# Patient Record
Sex: Female | Born: 1980 | Race: Black or African American | Hispanic: No | Marital: Married | Smoking: Never smoker
Health system: Southern US, Community
[De-identification: ages and names within clinical notes are randomized; demographics above are authoritative.]

## PROBLEM LIST (undated history)

## (undated) ENCOUNTER — Inpatient Hospital Stay (HOSPITAL_COMMUNITY): Payer: Self-pay

## (undated) DIAGNOSIS — Z789 Other specified health status: Secondary | ICD-10-CM

## (undated) DIAGNOSIS — O039 Complete or unspecified spontaneous abortion without complication: Secondary | ICD-10-CM

## (undated) DIAGNOSIS — R7309 Other abnormal glucose: Secondary | ICD-10-CM

## (undated) DIAGNOSIS — O368130 Decreased fetal movements, third trimester, not applicable or unspecified: Secondary | ICD-10-CM

## (undated) DIAGNOSIS — N926 Irregular menstruation, unspecified: Secondary | ICD-10-CM

## (undated) DIAGNOSIS — O021 Missed abortion: Secondary | ICD-10-CM

## (undated) DIAGNOSIS — O208 Other hemorrhage in early pregnancy: Secondary | ICD-10-CM

## (undated) DIAGNOSIS — E6609 Other obesity due to excess calories: Secondary | ICD-10-CM

## (undated) DIAGNOSIS — Z3A32 32 weeks gestation of pregnancy: Secondary | ICD-10-CM

## (undated) DIAGNOSIS — R7989 Other specified abnormal findings of blood chemistry: Secondary | ICD-10-CM

## (undated) DIAGNOSIS — Z6830 Body mass index (BMI) 30.0-30.9, adult: Secondary | ICD-10-CM

## (undated) DIAGNOSIS — Z369 Encounter for antenatal screening, unspecified: Secondary | ICD-10-CM

## (undated) DIAGNOSIS — Z3490 Encounter for supervision of normal pregnancy, unspecified, unspecified trimester: Secondary | ICD-10-CM

## (undated) DIAGNOSIS — O09521 Supervision of elderly multigravida, first trimester: Secondary | ICD-10-CM

## (undated) DIAGNOSIS — E66811 Obesity, class 1: Secondary | ICD-10-CM

## (undated) HISTORY — PX: NO PAST SURGERIES: SHX2092

## (undated) MED FILL — ONDANSETRON 4MG ODT: 4 MG | 10 days supply | Qty: 30 | Fill #0 | Status: AC

---

## 2006-11-22 ENCOUNTER — Emergency Department: Payer: Self-pay | Admitting: Internal Medicine

## 2007-05-02 ENCOUNTER — Inpatient Hospital Stay: Payer: Self-pay | Admitting: Obstetrics and Gynecology

## 2007-05-18 ENCOUNTER — Ambulatory Visit: Payer: Self-pay | Admitting: Obstetrics and Gynecology

## 2007-05-20 ENCOUNTER — Ambulatory Visit: Payer: Self-pay | Admitting: Obstetrics and Gynecology

## 2008-10-17 ENCOUNTER — Emergency Department (HOSPITAL_COMMUNITY): Admission: EM | Admit: 2008-10-17 | Discharge: 2008-10-17 | Payer: Self-pay | Admitting: Emergency Medicine

## 2009-05-04 ENCOUNTER — Inpatient Hospital Stay: Payer: Self-pay | Admitting: Obstetrics and Gynecology

## 2010-01-19 IMAGING — US US OB COMP LESS 14 WK
1 series · 14 of 28 positions shown · non-contrast
Comparison: None

CLINICAL DATA: First trimester and vaginal bleeding

OBSTETRIC <14 WK US AND TRANSVAGINAL OB US
TECHNIQUE: Both transabdominal and transvaginal ultrasound
examinations were performed for complete evaluation of the
gestation as well as the maternal uterus, adnexal regions, and
pelvic cul-de-sac.

[Series 1: us ob comp less 14 wk · 0.24mm/px · 14 of 66 slices shown]
[im 3/66]
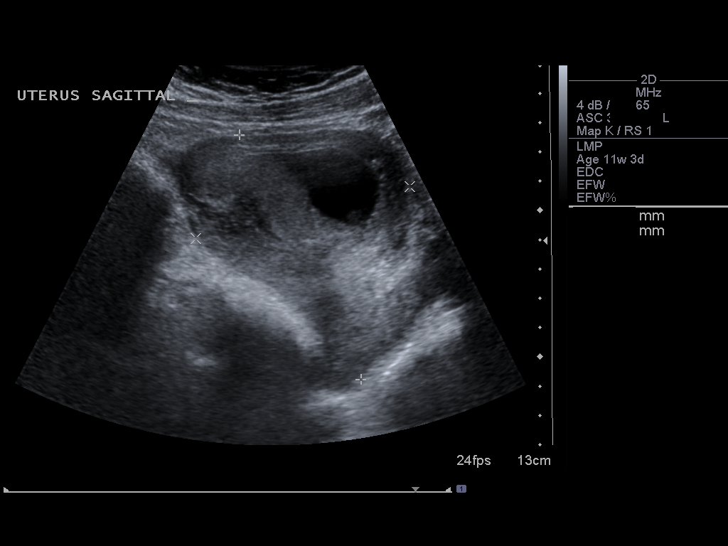
[im 8/66]
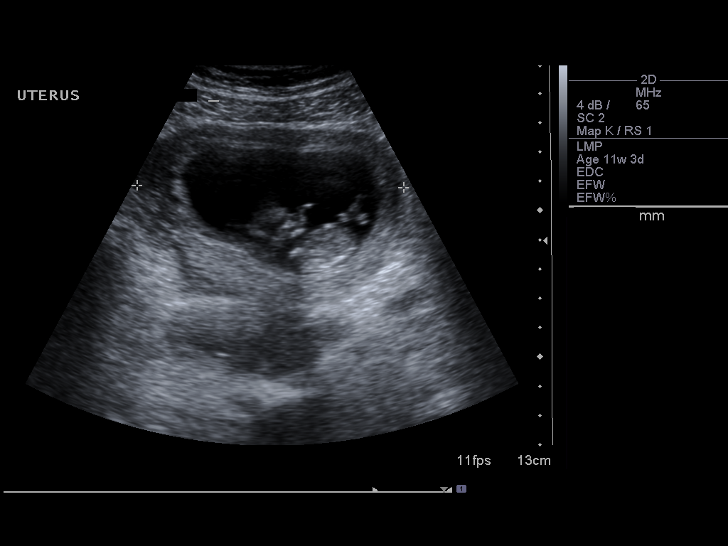
[im 13/66]
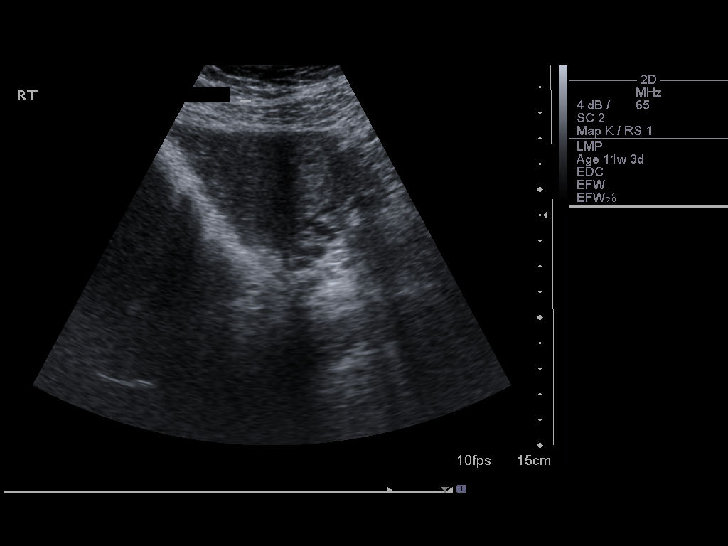
[im 17/66]
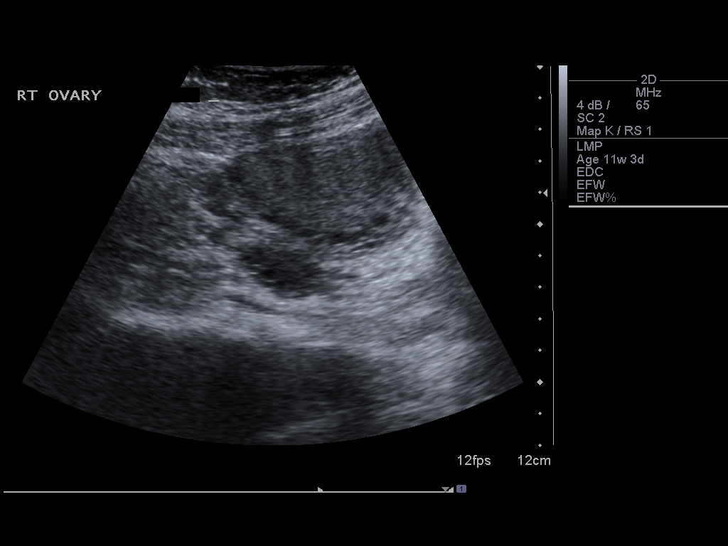
[im 22/66]
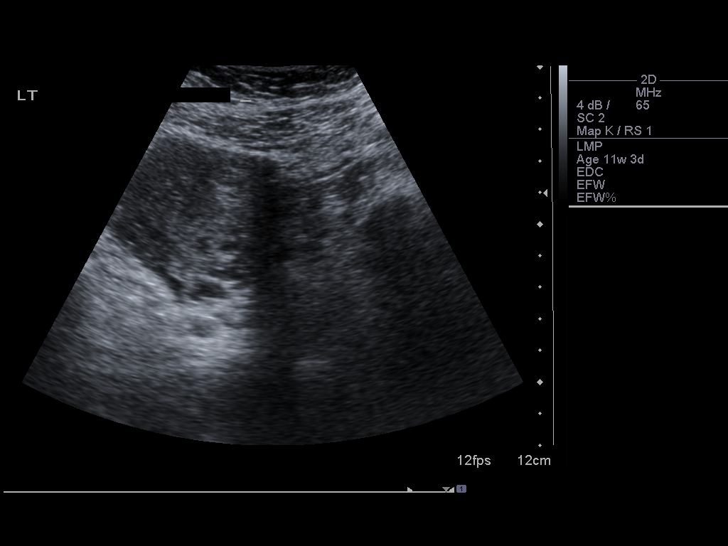
[im 27/66]
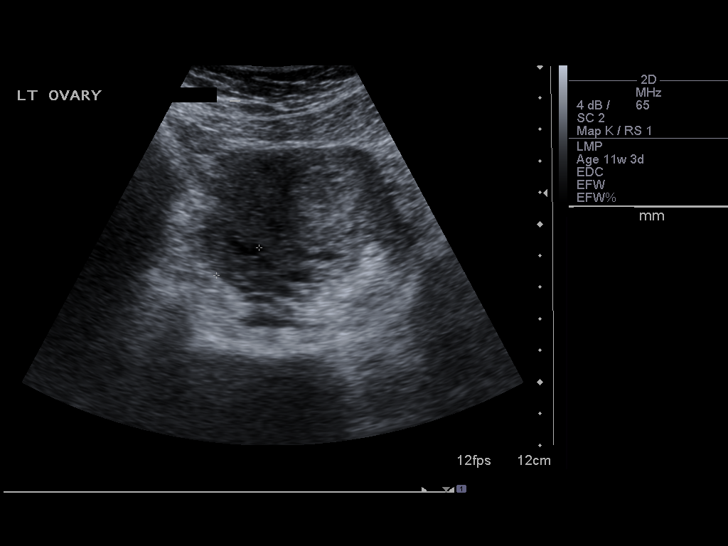
[im 32/66]
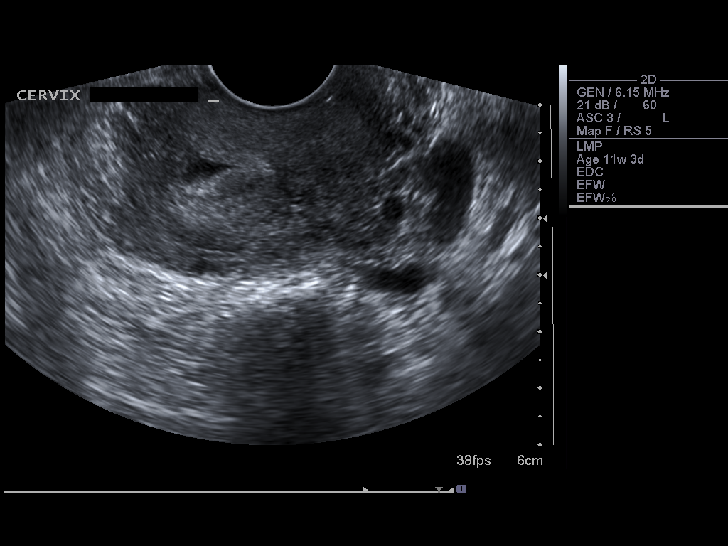
[im 37/66]
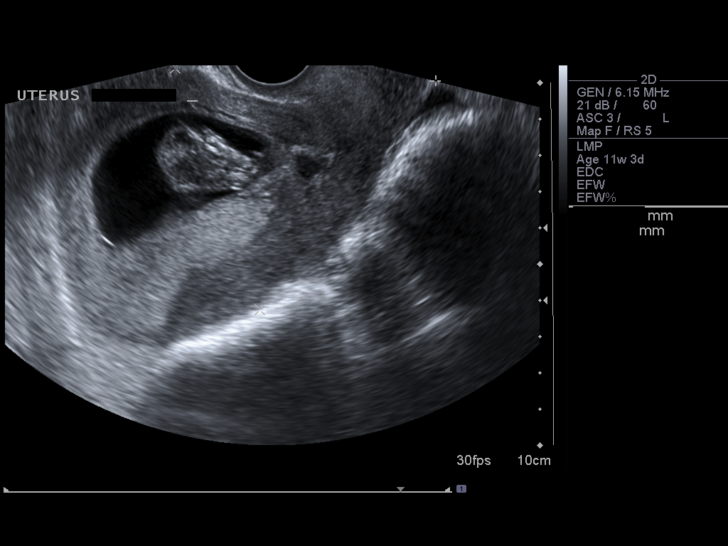
[im 41/66]
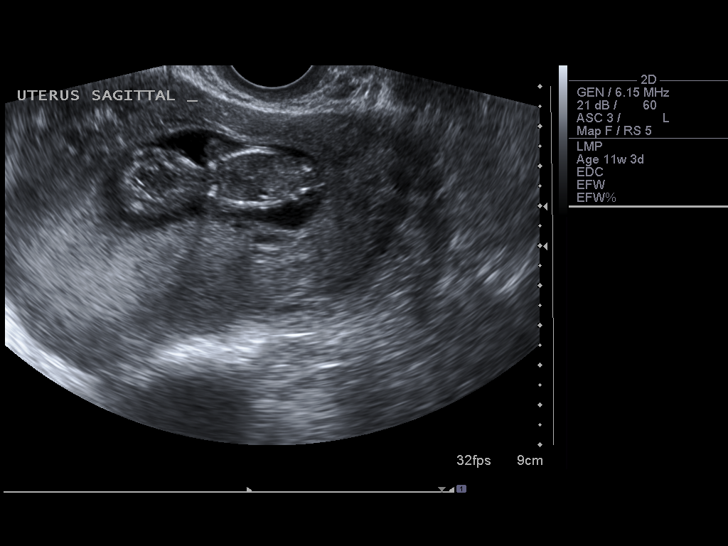
[im 46/66]
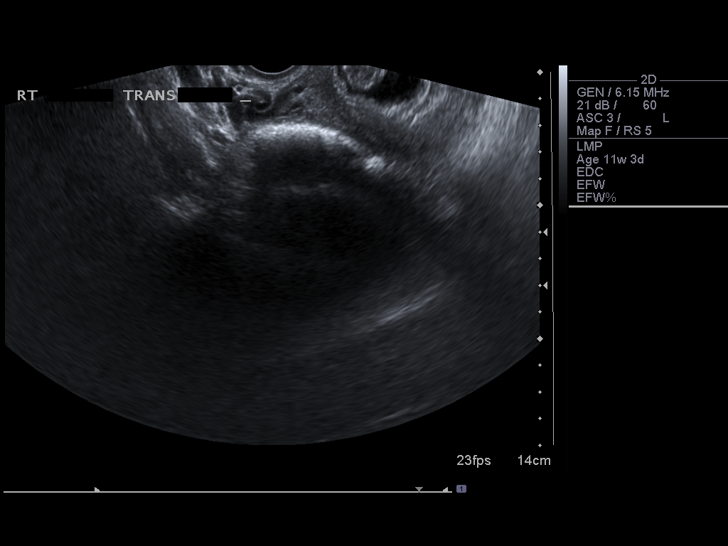
[im 51/66]
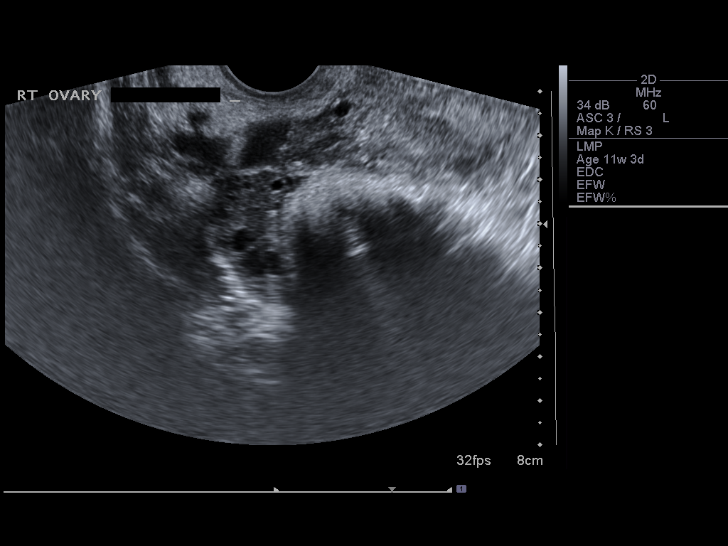
[im 56/66]
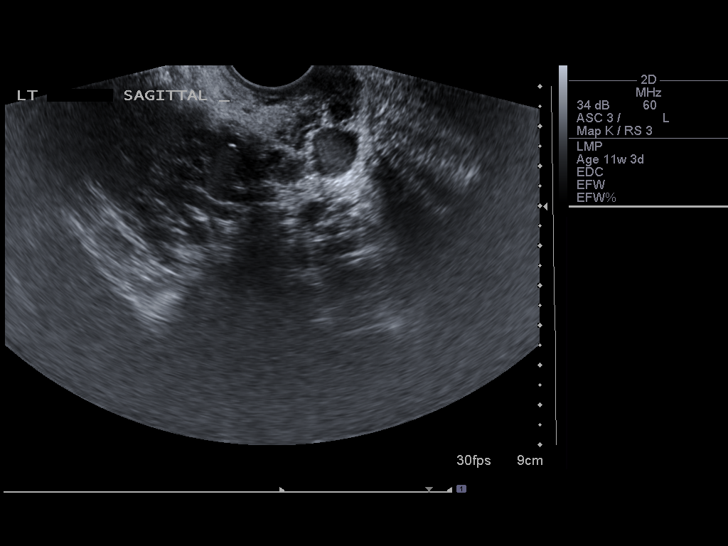
[im 61/66]
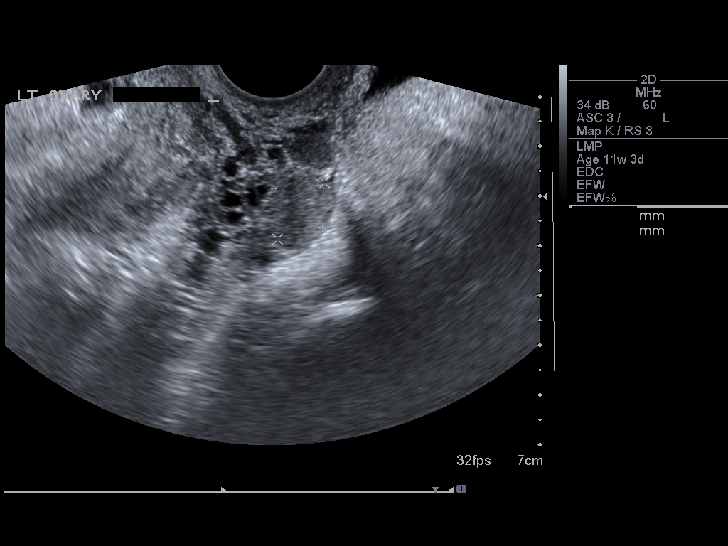
[im 66/66]
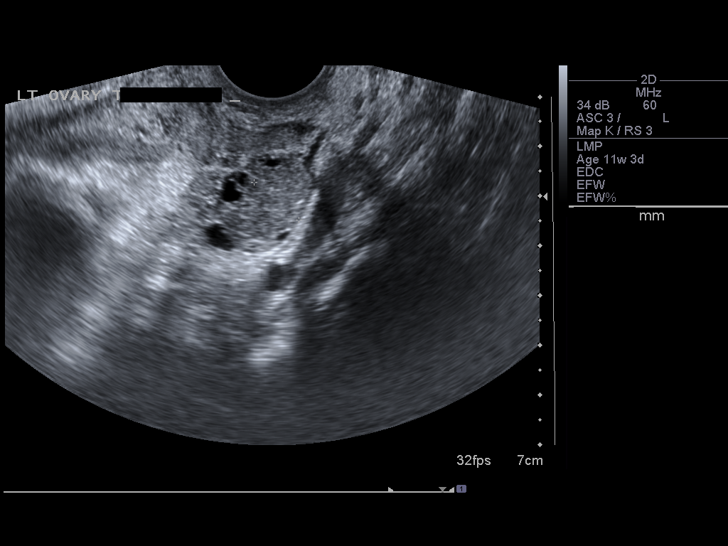

[14 of 28 positions shown; findings below may reference images not displayed]

Intrauterine gestational sac: Present
Yolk sac: Present
Embryo: Present
Cardiac Activity: Present
Heart Rate: 163 bpm

MSD:   mm      w     d
CRL: 49.9 millimeters           11   w  5   d         US EDC:
05/03/2009

Maternal uterus/adnexae:
A small amount of free fluid is present.  There is a small
subchorionic hemorrhage.  Ovaries are within normal limits.
IMPRESSION: Live intrauterine pregnancy with an estimated gestational age of 11
weeks and 5 days.  Fetal heart tones are 163 beats per minute.
Small subchorionic hemorrhage complicates the gestation.

## 2010-12-09 LAB — URINALYSIS, ROUTINE W REFLEX MICROSCOPIC
Glucose, UA: NEGATIVE mg/dL
Hgb urine dipstick: NEGATIVE
Specific Gravity, Urine: 1.034 — ABNORMAL HIGH (ref 1.005–1.030)

## 2010-12-09 LAB — HCG, QUANTITATIVE, PREGNANCY: hCG, Beta Chain, Quant, S: 82447 m[IU]/mL — ABNORMAL HIGH (ref <5)

## 2010-12-09 LAB — GC/CHLAMYDIA PROBE AMP, GENITAL: Chlamydia, DNA Probe: NEGATIVE

## 2010-12-09 LAB — WET PREP, GENITAL
Trich, Wet Prep: NONE SEEN
Yeast Wet Prep HPF POC: NONE SEEN

## 2010-12-09 LAB — POCT PREGNANCY, URINE: Preg Test, Ur: POSITIVE

## 2016-05-21 ENCOUNTER — Encounter (HOSPITAL_COMMUNITY): Payer: Self-pay | Admitting: *Deleted

## 2016-05-21 ENCOUNTER — Inpatient Hospital Stay (HOSPITAL_COMMUNITY)
Admission: AD | Admit: 2016-05-21 | Discharge: 2016-05-21 | Disposition: A | Discharge status: Home / Self Care | Payer: BLUE CROSS/BLUE SHIELD | Source: Ambulatory Visit | Attending: Obstetrics & Gynecology | Admitting: Obstetrics & Gynecology

## 2016-05-21 DIAGNOSIS — Z3A14 14 weeks gestation of pregnancy: Secondary | ICD-10-CM | POA: Diagnosis not present

## 2016-05-21 DIAGNOSIS — N898 Other specified noninflammatory disorders of vagina: Secondary | ICD-10-CM

## 2016-05-21 DIAGNOSIS — O26892 Other specified pregnancy related conditions, second trimester: Secondary | ICD-10-CM | POA: Insufficient documentation

## 2016-05-21 DIAGNOSIS — O26812 Pregnancy related exhaustion and fatigue, second trimester: Secondary | ICD-10-CM

## 2016-05-21 DIAGNOSIS — R42 Dizziness and giddiness: Secondary | ICD-10-CM | POA: Insufficient documentation

## 2016-05-21 DIAGNOSIS — R300 Dysuria: Secondary | ICD-10-CM | POA: Insufficient documentation

## 2016-05-21 DIAGNOSIS — O468X1 Other antepartum hemorrhage, first trimester: Secondary | ICD-10-CM

## 2016-05-21 DIAGNOSIS — O418X10 Other specified disorders of amniotic fluid and membranes, first trimester, not applicable or unspecified: Secondary | ICD-10-CM

## 2016-05-21 DIAGNOSIS — L298 Other pruritus: Secondary | ICD-10-CM

## 2016-05-21 HISTORY — DX: Other specified health status: Z78.9

## 2016-05-21 LAB — URINE MICROSCOPIC-ADD ON: RBC / HPF: NONE SEEN RBC/hpf (ref 0–5)

## 2016-05-21 LAB — CBC
HCT: 33.5 % — ABNORMAL LOW (ref 36.0–46.0)
HEMOGLOBIN: 11.8 g/dL — AB (ref 12.0–15.0)
MCH: 31.8 pg (ref 26.0–34.0)
MCHC: 35.2 g/dL (ref 30.0–36.0)
MCV: 90.3 fL (ref 78.0–100.0)
PLATELETS: 195 10*3/uL (ref 150–400)
RBC: 3.71 MIL/uL — AB (ref 3.87–5.11)
RDW: 13.8 % (ref 11.5–15.5)
WBC: 7.3 10*3/uL (ref 4.0–10.5)

## 2016-05-21 LAB — URINALYSIS, ROUTINE W REFLEX MICROSCOPIC
Bilirubin Urine: NEGATIVE
Glucose, UA: NEGATIVE mg/dL
Ketones, ur: NEGATIVE mg/dL
LEUKOCYTES UA: NEGATIVE
NITRITE: NEGATIVE
PH: 5.5 (ref 5.0–8.0)
Protein, ur: NEGATIVE mg/dL

## 2016-05-21 LAB — WET PREP, GENITAL
CLUE CELLS WET PREP: NONE SEEN
SPERM: NONE SEEN
Trich, Wet Prep: NONE SEEN
Yeast Wet Prep HPF POC: NONE SEEN

## 2016-05-21 LAB — OB RESULTS CONSOLE GBS: STREP GROUP B AG: POSITIVE

## 2016-05-21 MED ORDER — PRENATAL VITAMINS 0.8 MG PO TABS: 1.0000 | ORAL_TABLET | Freq: Every day | ORAL | 12 refills | Status: AC

## 2016-05-21 NOTE — Discharge Instructions (Signed)
Fatigue Fatigue is feeling tired all of the time, a lack of energy, or a lack of motivation. Occasional or mild fatigue is often a normal response to activity or life in general. However, long-lasting (chronic) or extreme fatigue may indicate an underlying medical condition. HOME CARE INSTRUCTIONS  Watch your fatigue for any changes. The following actions may help to lessen any discomfort you are feeling:  Talk to your health care provider about how much sleep you need each night. Try to get the required amount every night.  Take medicines only as directed by your health care provider.  Eat a healthy and nutritious diet. Ask your health care provider if you need help changing your diet.  Drink enough fluid to keep your urine clear or pale yellow.  Practice ways of relaxing, such as yoga, meditation, massage therapy, or acupuncture.  Exercise regularly.   Change situations that cause you stress. Try to keep your work and personal routine reasonable.  Do not abuse illegal drugs.  Limit alcohol intake to no more than 1 drink per day for nonpregnant women and 2 drinks per day for men. One drink equals 12 ounces of beer, 5 ounces of wine, or 1 ounces of hard liquor.  Take a multivitamin, if directed by your health care provider. SEEK MEDICAL CARE IF:   Your fatigue does not get better.  You have a fever.   You have unintentional weight loss or gain.  You have headaches.   You have difficulty:   Falling asleep.  Sleeping throughout the night.  You feel angry, guilty, anxious, or sad.   You are unable to have a bowel movement (constipation).   You skin is dry.   Your legs or another part of your body is swollen.  SEEK IMMEDIATE MEDICAL CARE IF:   You feel confused.   Your vision is blurry.  You feel faint or pass out.   You have a severe headache.   You have severe abdominal, pelvic, or back pain.   You have chest pain, shortness of breath, or an  irregular or fast heartbeat.   You are unable to urinate or you urinate less than normal.   You develop abnormal bleeding, such as bleeding from the rectum, vagina, nose, lungs, or nipples.  You vomit blood.   You have thoughts about harming yourself or committing suicide.   You are worried that you might harm someone else.    This information is not intended to replace advice given to you by your health care provider. Make sure you discuss any questions you have with your health care provider.   Document Released: 06/07/2007 Document Revised: 08/31/2014 Document Reviewed: 12/12/2013 Elsevier Interactive Patient Education 2016 [employer]. Second Trimester of Pregnancy The second trimester is from week 13 through week 28, months 4 through 6. The second trimester is often a time when you feel your best. Your body has also adjusted to being pregnant, and you begin to feel better physically. Usually, morning sickness has lessened or quit completely, you may have more energy, and you may have an increase in appetite. The second trimester is also a time when the fetus is growing rapidly. At the end of the sixth month, the fetus is about 9 inches long and weighs about 1 pounds. You will likely begin to feel the baby move (quickening) between 18 and 20 weeks of the pregnancy. BODY CHANGES Your body goes through many changes during pregnancy. The changes vary from woman to woman.  Your weight will continue to increase. You will notice your lower abdomen bulging out.  You may begin to get stretch marks on your hips, abdomen, and breasts.  You may develop headaches that can be relieved by medicines approved by your health care provider.  You may urinate more often because the fetus is pressing on your bladder.  You may develop or continue to have heartburn as a result of your pregnancy.  You may develop constipation because certain hormones are causing the muscles that push waste  through your intestines to slow down.  You may develop hemorrhoids or swollen, bulging veins (varicose veins).  You may have back pain because of the weight gain and pregnancy hormones relaxing your joints between the bones in your pelvis and as a result of a shift in weight and the muscles that support your balance.  Your breasts will continue to grow and be tender.  Your gums may bleed and may be sensitive to brushing and flossing.  Dark spots or blotches (chloasma, mask of pregnancy) may develop on your face. This will likely fade after the baby is born.  A dark line from your belly button to the pubic area (linea nigra) may appear. This will likely fade after the baby is born.  You may have changes in your hair. These can include thickening of your hair, rapid growth, and changes in texture. Some women also have hair loss during or after pregnancy, or hair that feels dry or thin. Your hair will most likely return to normal after your baby is born. WHAT TO EXPECT AT YOUR PRENATAL VISITS During a routine prenatal visit:  You will be weighed to make sure you and the fetus are growing normally.  Your blood pressure will be taken.  Your abdomen will be measured to track your baby's growth.  The fetal heartbeat will be listened to.  Any test results from the previous visit will be discussed. Your health care provider may ask you:  How you are feeling.  If you are feeling the baby move.  If you have had any abnormal symptoms, such as leaking fluid, bleeding, severe headaches, or abdominal cramping.  If you are using any tobacco products, including cigarettes, chewing tobacco, and electronic cigarettes.  If you have any questions. Other tests that may be performed during your second trimester include:  Blood tests that check for:  Low iron levels (anemia).  Gestational diabetes (between 24 and 28 weeks).  Rh antibodies.  Urine tests to check for infections, diabetes, or  protein in the urine.  An ultrasound to confirm the proper growth and development of the baby.  An amniocentesis to check for possible genetic problems.  Fetal screens for spina bifida and Down syndrome.  HIV (human immunodeficiency virus) testing. Routine prenatal testing includes screening for HIV, unless you choose not to have this test. HOME CARE INSTRUCTIONS   Avoid all smoking, herbs, alcohol, and unprescribed drugs. These chemicals affect the formation and growth of the baby.  Do not use any tobacco products, including cigarettes, chewing tobacco, and electronic cigarettes. If you need help quitting, ask your health care provider. You may receive counseling support and other resources to help you quit.  Follow your health care provider's instructions regarding medicine use. There are medicines that are either safe or unsafe to take during pregnancy.  Exercise only as directed by your health care provider. Experiencing uterine cramps is a good sign to stop exercising.  Continue to eat regular, healthy meals.  Wear a good support bra for breast tenderness.  Do not use hot tubs, steam rooms, or saunas.  Wear your seat belt at all times when driving.  Avoid raw meat, uncooked cheese, cat litter boxes, and soil used by cats. These carry germs that can cause birth defects in the baby.  Take your prenatal vitamins.  Take 1500-2000 mg of calcium daily starting at the 20th week of pregnancy until you deliver your baby.  Try taking a stool softener (if your health care provider approves) if you develop constipation. Eat more high-fiber foods, such as fresh vegetables or fruit and whole grains. Drink plenty of fluids to keep your urine clear or pale yellow.  Take warm sitz baths to soothe any pain or discomfort caused by hemorrhoids. Use hemorrhoid cream if your health care provider approves.  If you develop varicose veins, wear support hose. Elevate your feet for 15 minutes, 3-4  times a day. Limit salt in your diet.  Avoid heavy lifting, wear low heel shoes, and practice good posture.  Rest with your legs elevated if you have leg cramps or low back pain.  Visit your dentist if you have not gone yet during your pregnancy. Use a soft toothbrush to brush your teeth and be gentle when you floss.  A sexual relationship may be continued unless your health care provider directs you otherwise.  Continue to go to all your prenatal visits as directed by your health care provider. SEEK MEDICAL CARE IF:   You have dizziness.  You have mild pelvic cramps, pelvic pressure, or nagging pain in the abdominal area.  You have persistent nausea, vomiting, or diarrhea.  You have a bad smelling vaginal discharge.  You have pain with urination. SEEK IMMEDIATE MEDICAL CARE IF:   You have a fever.  You are leaking fluid from your vagina.  You have spotting or bleeding from your vagina.  You have severe abdominal cramping or pain.  You have rapid weight gain or loss.  You have shortness of breath with chest pain.  You notice sudden or extreme swelling of your face, hands, ankles, feet, or legs.  You have not felt your baby move in over an hour.  You have severe headaches that do not go away with medicine.  You have vision changes.   This information is not intended to replace advice given to you by your health care provider. Make sure you discuss any questions you have with your health care provider.   Document Released: 08/04/2001 Document Revised: 08/31/2014 Document Reviewed: 10/11/2012 Elsevier Interactive Patient Education [employer]
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
## 2016-05-23 ENCOUNTER — Other Ambulatory Visit: Payer: Self-pay | Admitting: Advanced Practice Midwife

## 2016-05-23 DIAGNOSIS — O26899 Other specified pregnancy related conditions, unspecified trimester: Secondary | ICD-10-CM

## 2016-05-23 DIAGNOSIS — R102 Pelvic and perineal pain: Principal | ICD-10-CM

## 2016-05-23 LAB — CULTURE, OB URINE

## 2016-05-23 NOTE — Progress Notes (Signed)
I was asked to add a transvag US order

## 2016-05-24 ENCOUNTER — Other Ambulatory Visit: Payer: Self-pay | Admitting: Nurse Practitioner

## 2016-05-24 ENCOUNTER — Telehealth: Payer: Self-pay | Admitting: Nurse Practitioner

## 2016-05-24 DIAGNOSIS — B951 Streptococcus, group B, as the cause of diseases classified elsewhere: Secondary | ICD-10-CM

## 2016-05-24 DIAGNOSIS — R8271 Bacteriuria: Secondary | ICD-10-CM | POA: Insufficient documentation

## 2016-05-24 MED ORDER — AMOXICILLIN 500 MG PO CAPS: 500.0000 mg | ORAL_CAPSULE | Freq: Three times a day (TID) | ORAL | 0 refills | Status: DC

## 2016-05-24 NOTE — Telephone Encounter (Signed)
Called client - positive GBS in urine culture.  Sent medication to her pharmacy.  Amoxicillin 500 mg PO TID x 7 days (#21) no refills.  Client states she will get her medication.  Advised her to let her prenatal care provider know about this result and the medication she is taking.  Client in agreement.  No questions voiced.  Zoe Bernheimerri Danniella Robben, NP

## 2016-06-04 ENCOUNTER — Ambulatory Visit (HOSPITAL_COMMUNITY): Payer: BLUE CROSS/BLUE SHIELD

## 2016-06-05 LAB — OB RESULTS CONSOLE HGB/HCT, BLOOD
HEMATOCRIT: 33 %
HEMOGLOBIN: 11.2 g/dL

## 2016-06-05 LAB — OB RESULTS CONSOLE ABO/RH: RH TYPE: POSITIVE

## 2016-06-05 LAB — OB RESULTS CONSOLE RUBELLA ANTIBODY, IGM: RUBELLA: IMMUNE

## 2016-06-05 LAB — OB RESULTS CONSOLE ANTIBODY SCREEN: ANTIBODY SCREEN: NEGATIVE

## 2016-06-05 LAB — OB RESULTS CONSOLE HIV ANTIBODY (ROUTINE TESTING): HIV: NONREACTIVE

## 2016-06-05 LAB — OB RESULTS CONSOLE HEPATITIS B SURFACE ANTIGEN: Hepatitis B Surface Ag: NEGATIVE

## 2016-06-05 LAB — OB RESULTS CONSOLE RPR: RPR: NONREACTIVE

## 2016-06-05 LAB — OB RESULTS CONSOLE VARICELLA ZOSTER ANTIBODY, IGG: VARICELLA IGG: IMMUNE

## 2016-06-15 ENCOUNTER — Encounter: Payer: Self-pay | Admitting: Obstetrics & Gynecology

## 2016-07-06 ENCOUNTER — Encounter: Payer: Self-pay | Admitting: Family Medicine

## 2016-08-24 NOTE — L&D Delivery Note (Signed)
Vaginal Delivery Note  36 y.o. G3P2002 at 5419w2d delivered a viable female infant at 2215 in cephalic, LOP position. Loose nuchal cord x1 easily reduced. Right anterior shoulder delivered with ease. Sixty sec delayed cord clamping. Cord clamped x2 and cut. Placenta delivered spontaneously intact, with 3VC. Fundus firm on exam with massage and pitocin.  Mother: Anesthesia: Epidural Laceration: 1st degree x1 Suture repair: 3.0 Vicryl x1 EBL: 200 mL  Baby: Apgars: 8, 9 Weight: Pending Cord pH: Not sent  Good hemostasis noted. Mom to postpartum.  Baby to Couplet care / Skin to Skin.  Wendee Beaversavid J McMullen, DO, PGY-1 11/17/2016, 10:38 PM

## 2016-08-31 LAB — OB RESULTS CONSOLE HGB/HCT, BLOOD
HCT: 33 %
Hemoglobin: 10.7 g/dL

## 2016-08-31 LAB — OB RESULTS CONSOLE RPR: RPR: NONREACTIVE

## 2016-08-31 LAB — OB RESULTS CONSOLE PLATELET COUNT: PLATELETS: 165 10*3/uL

## 2016-08-31 LAB — GLUCOSE, 1 HOUR: Glucose 1 Hr Prenatal, POC: 146 mg/dL

## 2016-08-31 LAB — OB RESULTS CONSOLE HIV ANTIBODY (ROUTINE TESTING): HIV: NONREACTIVE

## 2016-09-07 LAB — GLUCOSE, 3 HOUR

## 2016-10-12 ENCOUNTER — Encounter: Payer: Self-pay | Admitting: *Deleted

## 2016-10-13 ENCOUNTER — Telehealth: Payer: Self-pay | Admitting: *Deleted

## 2016-10-13 NOTE — Telephone Encounter (Signed)
Pt left message yesterday stating that she has appt in our office on 3/7 for [facility]. She wants to know if she should keep her next appt @ [facility] for GBS culture or cancel and just have everything done in our office @ scheduled appt. I returned that call to pt and left message stating that she will not require GBS testing as it was previously positive in her urine.  Provided baby is moving well and she is having no problems, she may keep scheduled appt in our office on 3/7 unless she has a reason or desire to be [facility] prior to that date.

## 2016-10-21 DIAGNOSIS — B951 Streptococcus, group B, as the cause of diseases classified elsewhere: Secondary | ICD-10-CM

## 2016-10-22 ENCOUNTER — Encounter: Payer: Self-pay | Admitting: *Deleted

## 2016-10-28 ENCOUNTER — Encounter: Payer: Self-pay | Admitting: Obstetrics and Gynecology

## 2016-10-28 ENCOUNTER — Ambulatory Visit (INDEPENDENT_AMBULATORY_CARE_PROVIDER_SITE_OTHER): Payer: Medicaid Other | Admitting: Obstetrics and Gynecology

## 2016-10-28 DIAGNOSIS — O09523 Supervision of elderly multigravida, third trimester: Secondary | ICD-10-CM | POA: Insufficient documentation

## 2016-10-28 DIAGNOSIS — O99810 Abnormal glucose complicating pregnancy: Secondary | ICD-10-CM | POA: Diagnosis not present

## 2016-10-28 DIAGNOSIS — Z3483 Encounter for supervision of other normal pregnancy, third trimester: Secondary | ICD-10-CM

## 2016-10-28 DIAGNOSIS — Z3493 Encounter for supervision of normal pregnancy, unspecified, third trimester: Secondary | ICD-10-CM | POA: Insufficient documentation

## 2016-10-28 NOTE — Progress Notes (Addendum)
New OB Note  10/28/2016   [facility]  Chief Complaint: NOB  Transfer of Care Patient: Yes, [facility]  History of Present Illness: Ms. Zoe Gomez is a 36 y.o. [id number] @ 37/3 weeks (EDC 3/25, based on Patient's last menstrual period was 02/09/2016.=early 2nd trimester u/s).  Preg complicated by has Group beta Strep positive; AMA (advanced maternal age) multigravida 35+, third trimester; and Abnormal glucose affecting pregnancy on her problem list.   Patient transferring to us b/c [place] moved to [place].  ROS: A 12-point review of systems was performed and negative, except as stated in the above HPI.  OBGYN History: As per HPI. OB History  Gravida Para Term Preterm AB Living  3 2 2     2   SAB TAB Ectopic Multiple Live Births          2    # Outcome Date GA Lbr Len/2nd Weight Sex Delivery Anes PTL Lv  3 Current           2 Term      Vag-Spont   LIV  1 Term      Vag-Spont   LIV    Obstetric Comments  Last child born 362010. Largest prior 8lbs. G1 FAVD with epis due to FHT decel.      Past Medical History: Past Medical History:  Diagnosis Date  . Medical history non-contributory     Past Surgical History: Past Surgical History:  Procedure Laterality Date  . NO PAST SURGERIES      Family History:  History reviewed. No pertinent family history.  Social History:  Social History   Social History  . Marital status: Married    Spouse name: N/A  . Number of children: N/A  . Years of education: N/A   Occupational History  . Not on file.   Social History Main Topics  . Smoking status: Never Smoker  . Smokeless tobacco: Never Used  . Alcohol use No  . Drug use: No  . Sexual activity: Yes    Birth control/ protection: Pill, None   Other Topics Concern  . Not on file   Social History Narrative  . No narrative on file    Allergy: No Known Allergies  Health Maintenance:  Mammogram Up to Date: not applicable  Current Outpatient  Medications: PNV  Physical Exam:   BP 110/64   Pulse 98   Ht 5\' 10"  (1.778 m)   Wt 225 lb 3.2 oz (102.2 kg)   LMP 02/09/2016   BMI 32.31 kg/m  Body mass index is 32.31 kg/m. Fundal height: 38 FHTs: 140s  General appearance: Well nourished, well developed female in no acute distress.   Laboratory: B pos/VI/RI/rpr neg/hiv neg/hepB neg/tdap UTD/pap smear status unknown/ 1hr 146 with normal 3hr  Imaging:  Normal anatomy scan  Assessment: pt doing well  Plan: 1. AMA (advanced maternal age) multigravida 35+, third trimester Pt states she declined genetic screening  2. Abnormal glucose affecting pregnancy Normal 3hr  3. Pregnancy Routine care. POPs. Ask about pap smear next visit. Practice style d/w pt.   4. GBS bacteruria tx in labor  Problem list reviewed and updated.  Follow up in 1 weeks.  >50% of 20 min visit spent on counseling and coordination of care.     Cornelia Copaharlie Shyne Resch, Jr. MD Attending [facility](Faculty Practice)

## 2016-11-04 ENCOUNTER — Ambulatory Visit (INDEPENDENT_AMBULATORY_CARE_PROVIDER_SITE_OTHER): Payer: Medicaid Other | Admitting: Medical

## 2016-11-04 VITALS — BP 104/60 | Wt 226.1 lb

## 2016-11-04 DIAGNOSIS — O09523 Supervision of elderly multigravida, third trimester: Secondary | ICD-10-CM | POA: Diagnosis not present

## 2016-11-04 DIAGNOSIS — Z3483 Encounter for supervision of other normal pregnancy, third trimester: Secondary | ICD-10-CM

## 2016-11-04 NOTE — Progress Notes (Signed)
   PRENATAL VISIT NOTE  Subjective:  Zoe Gomez is a 36 y.o. G3P2002 at 6941w3d being seen today for ongoing prenatal care.  She is currently monitored for the following issues for this high-risk pregnancy and has GBS bacteriuria; AMA (advanced maternal age) multigravida 35+, third trimester; Abnormal glucose affecting pregnancy; and Supervision of normal pregnancy in third trimester on her problem list.  Patient reports no complaints.  Contractions: Irregular. Vag. Bleeding: None.  Movement: Present. Denies leaking of fluid.   The following portions of the patient's history were reviewed and updated as appropriate: allergies, current medications, past family history, past medical history, past social history, past surgical history and problem list. Problem list updated.  Objective:   Vitals:   11/04/16 1508  BP: 104/60  Weight: 226 lb 1.6 oz (102.6 kg)    Fetal Status: Fetal Heart Rate (bpm): 151 Fundal Height: 38 cm Movement: Present     General:  Alert, oriented and cooperative. Patient is in no acute distress.  Skin: Skin is warm and dry. No rash noted.   Cardiovascular: Normal heart rate noted  Respiratory: Normal respiratory effort, no problems with respiration noted  Abdomen: Soft, gravid, appropriate for gestational age. Pain/Pressure: Present     Pelvic:  Cervical exam performed Dilation: 1 Effacement (%): Thick Station: -3  Extremities: Normal range of motion.     Mental Status: Normal mood and affect. Normal behavior. Normal judgment and thought content.   Assessment and Plan:  Pregnancy: G3P2002 at 1641w3d  1. Encounter for supervision of other normal pregnancy in third trimester - Doing well - reminded of +GBS status and need for treatment in labor  Term labor symptoms and general obstetric precautions including but not limited to vaginal bleeding, contractions, leaking of fluid and fetal movement were reviewed in detail with the patient. Please refer to After Visit  Summary for other counseling recommendations.  Return in about 1 week (around 11/11/2016) for LOB.   Marny LowensteinJulie N Shanesha Bednarz, PA-C

## 2016-11-04 NOTE — Patient Instructions (Signed)
Fetal Movement Counts Patient Name: ________________________________________________ Patient Due Date: ____________________ What is a fetal movement count? A fetal movement count is the number of times that you feel your baby move during a certain amount of time. This may also be called a fetal kick count. A fetal movement count is recommended for every pregnant woman. You may be asked to start counting fetal movements as early as week 28 of your pregnancy. Pay attention to when your baby is most active. You may notice your baby's sleep and wake cycles. You may also notice things that make your baby move more. You should do a fetal movement count:  When your baby is normally most active.  At the same time each day. A good time to count movements is while you are resting, after having something to eat and drink. How do I count fetal movements? 1. Find a quiet, comfortable area. Sit, or lie down on your side. 2. Write down the date, the start time and stop time, and the number of movements that you felt between those two times. Take this information with you to your health care visits. 3. For 2 hours, count kicks, flutters, swishes, rolls, and jabs. You should feel at least 10 movements during 2 hours. 4. You may stop counting after you have felt 10 movements. 5. If you do not feel 10 movements in 2 hours, have something to eat and drink. Then, keep resting and counting for 1 hour. If you feel at least 4 movements during that hour, you may stop counting. Contact a health care provider if:  You feel fewer than 4 movements in 2 hours.  Your baby is not moving like he or she usually does. Date: ____________ Start time: ____________ Stop time: ____________ Movements: ____________ Date: ____________ Start time: ____________ Stop time: ____________ Movements: ____________ Date: ____________ Start time: ____________ Stop time: ____________ Movements: ____________ Date: ____________ Start time:  ____________ Stop time: ____________ Movements: ____________ Date: ____________ Start time: ____________ Stop time: ____________ Movements: ____________ Date: ____________ Start time: ____________ Stop time: ____________ Movements: ____________ Date: ____________ Start time: ____________ Stop time: ____________ Movements: ____________ Date: ____________ Start time: ____________ Stop time: ____________ Movements: ____________ Date: ____________ Start time: ____________ Stop time: ____________ Movements: ____________ This information is not intended to replace advice given to you by your health care provider. Make sure you discuss any questions you have with your health care provider. Document Released: 09/09/2006 Document Revised: 04/08/2016 Document Reviewed: 09/19/2015 Elsevier Interactive Patient Education  2017 [employer] Braxton Hicks Contractions Contractions of the uterus can occur throughout pregnancy, but they are not always a sign that you are in labor. You may have practice contractions called Braxton Hicks contractions. These false labor contractions are sometimes confused with true labor. What are Braxton Hicks contractions? Braxton Hicks contractions are tightening movements that occur in the muscles of the uterus before labor. Unlike true labor contractions, these contractions do not result in opening (dilation) and thinning of the cervix. Toward the end of pregnancy (32-34 weeks), Braxton Hicks contractions can happen more often and may become stronger. These contractions are sometimes difficult to tell apart from true labor because they can be very uncomfortable. You should not feel embarrassed if you go to the hospital with false labor. Sometimes, the only way to tell if you are in true labor is for your health care provider to look for changes in the cervix. The health care provider will do a physical exam and may monitor your contractions. If you   are not in true labor, the exam  should show that your cervix is not dilating and your water has not broken. If there are no prenatal problems or other health problems associated with your pregnancy, it is completely safe for you to be sent home with false labor. You may continue to have Braxton Hicks contractions until you go into true labor. How can I tell the difference between true labor and false labor?  Differences  False labor  Contractions last 30-70 seconds.: Contractions are usually shorter and not as strong as true labor contractions.  Contractions become very regular.: Contractions are usually irregular.  Discomfort is usually felt in the top of the uterus, and it spreads to the lower abdomen and low back.: Contractions are often felt in the front of the lower abdomen and in the groin.  Contractions do not go away with walking.: Contractions may go away when you walk around or change positions while lying down.  Contractions usually become more intense and increase in frequency.: Contractions get weaker and are shorter-lasting as time goes on.  The cervix dilates and gets thinner.: The cervix usually does not dilate or become thin. Follow these instructions at home:  Take over-the-counter and prescription medicines only as told by your health care provider.  Keep up with your usual exercises and follow other instructions from your health care provider.  Eat and drink lightly if you think you are going into labor.  If Braxton Hicks contractions are making you uncomfortable:  Change your position from lying down or resting to walking, or change from walking to resting.  Sit and rest in a tub of warm water.  Drink enough fluid to keep your urine clear or pale yellow. Dehydration may cause these contractions.  Do slow and deep breathing several times an hour.  Keep all follow-up prenatal visits as told by your health care provider. This is important. Contact a health care provider if:  You have a  fever.  You have continuous pain in your abdomen. Get help right away if:  Your contractions become stronger, more regular, and closer together.  You have fluid leaking or gushing from your vagina.  You pass blood-tinged mucus (bloody show).  You have bleeding from your vagina.  You have low back pain that you never had before.  You feel your baby's head pushing down and causing pelvic pressure.  Your baby is not moving inside you as much as it used to. Summary  Contractions that occur before labor are called Braxton Hicks contractions, false labor, or practice contractions.  Braxton Hicks contractions are usually shorter, weaker, farther apart, and less regular than true labor contractions. True labor contractions usually become progressively stronger and regular and they become more frequent.  Manage discomfort from Braxton Hicks contractions by changing position, resting in a warm bath, drinking plenty of water, or practicing deep breathing. This information is not intended to replace advice given to you by your health care provider. Make sure you discuss any questions you have with your health care provider. Document Released: 08/10/2005 Document Revised: 06/29/2016 Document Reviewed: 06/29/2016 Elsevier Interactive Patient Education  2017 [employer] Inc[employer]  

## 2016-11-11 ENCOUNTER — Ambulatory Visit (INDEPENDENT_AMBULATORY_CARE_PROVIDER_SITE_OTHER): Payer: Medicaid Other | Admitting: Obstetrics and Gynecology

## 2016-11-11 VITALS — BP 102/62 | HR 89 | Wt 228.0 lb

## 2016-11-11 DIAGNOSIS — O09523 Supervision of elderly multigravida, third trimester: Secondary | ICD-10-CM | POA: Diagnosis not present

## 2016-11-11 DIAGNOSIS — Z3483 Encounter for supervision of other normal pregnancy, third trimester: Secondary | ICD-10-CM

## 2016-11-11 DIAGNOSIS — R8271 Bacteriuria: Secondary | ICD-10-CM

## 2016-11-11 NOTE — Progress Notes (Signed)
   PRENATAL VISIT NOTE  Subjective:  Zoe Gomez is a 36 y.o. G3P2002 at 2547w3d being seen today for ongoing prenatal care.  She is currently monitored for the following issues for this low-risk pregnancy and has GBS bacteriuria; AMA (advanced maternal age) multigravida 35+, third trimester; Abnormal glucose affecting pregnancy; and Supervision of normal pregnancy in third trimester on her problem list.  Patient reports no complaints.  Contractions: Irregular. Vag. Bleeding: None.  Movement: Present. Denies leaking of fluid.   The following portions of the patient's history were reviewed and updated as appropriate: allergies, current medications, past family history, past medical history, past social history, past surgical history and problem list. Problem list updated.  Objective:   Vitals:   11/11/16 1448  BP: 102/62  Pulse: 89  Weight: 228 lb (103.4 kg)    Fetal Status: Fetal Heart Rate (bpm): 150 Fundal Height: 39 cm Movement: Present     General:  Alert, oriented and cooperative. Patient is in no acute distress.  Skin: Skin is warm and dry. No rash noted.   Cardiovascular: Normal heart rate noted  Respiratory: Normal respiratory effort, no problems with respiration noted  Abdomen: Soft, gravid, appropriate for gestational age. Pain/Pressure: Present     Pelvic:  Cervical exam performed Dilation: 2.5 Effacement (%): Thick Station: -3  Extremities: Normal range of motion.  Edema: None  Mental Status: Normal mood and affect. Normal behavior. Normal judgment and thought content.   Assessment and Plan:  Pregnancy: G3P2002 at 347w3d  1. Encounter for supervision of other normal pregnancy in third trimester Patient is doing well without complaints Membranes stripped today  2. AMA (advanced maternal age) multigravida 35+, third trimester   3. GBS bacteriuria Will provide prophylaxis in labor  Term labor symptoms and general obstetric precautions including but not limited to  vaginal bleeding, contractions, leaking of fluid and fetal movement were reviewed in detail with the patient. Please refer to After Visit Summary for other counseling recommendations.  No Follow-up on file.   Catalina AntiguaPeggy Cru Kritikos, MD

## 2016-11-12 ENCOUNTER — Inpatient Hospital Stay (HOSPITAL_COMMUNITY)
Admission: AD | Admit: 2016-11-12 | Discharge: 2016-11-13 | Disposition: A | Discharge status: Home / Self Care | Payer: BLUE CROSS/BLUE SHIELD | Source: Ambulatory Visit | Attending: Obstetrics & Gynecology | Admitting: Obstetrics & Gynecology

## 2016-11-12 ENCOUNTER — Encounter (HOSPITAL_COMMUNITY): Payer: Self-pay | Admitting: *Deleted

## 2016-11-12 DIAGNOSIS — Z3A39 39 weeks gestation of pregnancy: Secondary | ICD-10-CM

## 2016-11-12 DIAGNOSIS — O368990 Maternal care for other specified fetal problems, unspecified trimester, not applicable or unspecified: Secondary | ICD-10-CM

## 2016-11-12 DIAGNOSIS — O289 Unspecified abnormal findings on antenatal screening of mother: Secondary | ICD-10-CM | POA: Insufficient documentation

## 2016-11-12 DIAGNOSIS — R8271 Bacteriuria: Secondary | ICD-10-CM

## 2016-11-12 DIAGNOSIS — O368930 Maternal care for other specified fetal problems, third trimester, not applicable or unspecified: Secondary | ICD-10-CM | POA: Insufficient documentation

## 2016-11-12 DIAGNOSIS — O479 False labor, unspecified: Secondary | ICD-10-CM

## 2016-11-12 NOTE — Progress Notes (Addendum)
G3P2 @ 39.[redacted] wksga. Presents to triage for labor/contraction. Denies LOF or bleeding. Loss of mucus plug with bloody show. Contractions started 1730 and getting closer and stronger together. VSS see flow sheet for details.   GBS+  2329: SVE: 3.5/70/-2 vertex  2337: Provider notified. Report status of pt given. Orders received to recheck cervix in an hr. Made aware of 10X10 acels with a variable. Will cont to monitor. Once achieve reactive strip, will have pt walk   Juice given x2. Pt turned to other side.   0022: MD notified. Report status of pt given. Ordered for BPP.   0028: up to bathroom  0056: pt to U/S via wheelchair  0140: back from U/S  0150: EFM applied and tracing resumed.   0151: Provider notified. Report status of pt given. Orders received to discharge pt home.  Discharge instructions given with pt understanding. Pt left for home via ambulatory

## 2016-11-12 NOTE — MAU Note (Signed)
PT  SAYS SHE HURTS  BAD  WITH  UC   -   7 PM.  BLOODY  SHOW  AT 3PM.        PNC-   CLINIC.     VE YESTERDAY -  3 CM  . DENIES HSV AND  MRSA.  GBS- POSITIVE.

## 2016-11-13 ENCOUNTER — Inpatient Hospital Stay (HOSPITAL_COMMUNITY): Payer: BLUE CROSS/BLUE SHIELD

## 2016-11-13 ENCOUNTER — Inpatient Hospital Stay (EMERGENCY_DEPARTMENT_HOSPITAL)
Admission: AD | Admit: 2016-11-13 | Discharge: 2016-11-13 | Disposition: A | Discharge status: Home / Self Care | Payer: BLUE CROSS/BLUE SHIELD | Source: Ambulatory Visit | Attending: Obstetrics & Gynecology | Admitting: Obstetrics & Gynecology

## 2016-11-13 DIAGNOSIS — O479 False labor, unspecified: Secondary | ICD-10-CM

## 2016-11-13 DIAGNOSIS — Z3A39 39 weeks gestation of pregnancy: Secondary | ICD-10-CM | POA: Insufficient documentation

## 2016-11-13 DIAGNOSIS — O368930 Maternal care for other specified fetal problems, third trimester, not applicable or unspecified: Secondary | ICD-10-CM | POA: Diagnosis not present

## 2016-11-13 DIAGNOSIS — Z79899 Other long term (current) drug therapy: Secondary | ICD-10-CM | POA: Insufficient documentation

## 2016-11-13 DIAGNOSIS — O368130 Decreased fetal movements, third trimester, not applicable or unspecified: Secondary | ICD-10-CM | POA: Insufficient documentation

## 2016-11-13 DIAGNOSIS — O289 Unspecified abnormal findings on antenatal screening of mother: Secondary | ICD-10-CM | POA: Diagnosis not present

## 2016-11-13 DIAGNOSIS — O471 False labor at or after 37 completed weeks of gestation: Secondary | ICD-10-CM

## 2016-11-13 NOTE — MAU Note (Signed)
Patient presents with having been seen in MAU last night for contractions, there was concern over a non reactive NST and patient was sent for BPP which was normal and then discharged home, patient reports decreased fetal movement over past 3 hours only feeling baby move 2 times. Denies contractions at this time.

## 2016-11-13 NOTE — MAU Provider Note (Signed)
Chief Complaint:  Decreased Fetal Movement   HPI: Zoe Gomez is a 36 y.o. G3P2002 at 3342w5d who presents to maternity admissions reporting contractions and decreased fetal movement.  Initially this AM she was not feeling baby move, so she decided to come in. Patient states she is feeling baby move now, but has not in the past 30 minutes, and once at home before leaving to come here.  Had contractions yesterday and slightly this AM, but not since 5AM.   Was evaluated in MAU last night/just after midnight today, had BPP was 8/8.   Denies leakage of fluid or vaginal bleeding.   Pregnancy Course:   Past Medical History: Past Medical History:  Diagnosis Date  . Medical history non-contributory     Past obstetric history: OB History  Gravida Para Term Preterm AB Living  3 2 2     2   SAB TAB Ectopic Multiple Live Births          2    # Outcome Date GA Lbr Len/2nd Weight Sex Delivery Anes PTL Lv  3 Current           2 Term      Vag-Spont   LIV  1 Term      Vag-Spont   LIV    Obstetric Comments  Last child born [birth year]. Largest prior 8lbs. G1 FAVD with epis due to FHT decel.     Past Surgical History: Past Surgical History:  Procedure Laterality Date  . NO PAST SURGERIES       Family History: No family history on file.  Social History: Social History  Substance Use Topics  . Smoking status: Never Smoker  . Smokeless tobacco: Never Used  . Alcohol use No    Allergies: No Known Allergies  Meds:  Prescriptions Prior to Admission  Medication Sig Dispense Refill Last Dose  . acetaminophen (TYLENOL) 325 MG tablet Take 650 mg by mouth every 6 (six) hours as needed.   Not Taking  . omeprazole (PRILOSEC) 20 MG capsule Take 20 mg by mouth as needed.   Not Taking  . Prenatal Multivit-Min-Fe-FA (PRENATAL VITAMINS) 0.8 MG tablet Take 1 tablet by mouth daily. 30 tablet 12 Taking  . ranitidine (ZANTAC) 150 MG tablet Take 150 mg by mouth as needed for heartburn.   Taking    I  have reviewed patient's Past Medical Hx, Surgical Hx, Family Hx, Social Hx, medications and allergies.   ROS:  A comprehensive ROS was negative except per HPI.    Physical Exam  Patient Vitals for the past 24 hrs:  BP Temp Temp src Pulse Resp  11/13/16 0833 126/75 98 F (36.7 C) Oral (!) 104 18   Constitutional: Well-developed, well-nourished female in no acute distress.  Cardiovascular: normal rate Respiratory: normal effort GI: Abd soft, non-tender, gravid appropriate for gestational age. Pos BS x 4 MS: Extremities nontender, no edema, normal ROM Neurologic: Alert and oriented x 4.  GU: Neg CVAT. SVE: 3.5/70/-3, unchanged from previous exam before discharge this AM.   Labs: No results found for this or any previous visit (from the past 24 hour(s)).  Imaging:  No results found.  MAU Course: NST BPP - 8/8 this AM  SVE - unchanged   MDM: Plan of care reviewed with patient, including labs and tests ordered and medical treatment. Reviewed fetal kick counts and results of BPP from this AM. Discussed indications to return to MAU. BPP 10/10 today with NST, reassuring.   I personally reviewed the  patient's NST today, found to be REACTIVE. 140 bpm, mod var, +accels, no decels. CTX: Occasional, irregular.   Assessment: 1. Decreased fetal movement affecting management of pregnancy in third trimester, single or unspecified fetus   2. False labor     Plan: Discharge home in stable condition.  Labor precautions and fetal kick counts    Allergies as of 11/13/2016   No Known Allergies     Medication List    TAKE these medications   omeprazole 20 MG capsule Commonly known as:  PRILOSEC Take 20 mg by mouth as needed.   Prenatal Vitamins 0.8 MG tablet Take 1 tablet by mouth daily.   ranitidine 150 MG tablet Commonly known as:  ZANTAC Take 150 mg by mouth as needed for heartburn.       Jen Mow, DO [facility], [facility] 11/13/2016 9:01 AM

## 2016-11-13 NOTE — MAU Note (Signed)
Patient brought directly back to room 6 from lobby, fhr 160

## 2016-11-13 NOTE — Discharge Instructions (Signed)
Fetal Movement Counts  Patient Name: ________________________________________________ Patient Due Date: ____________________  What is a fetal movement count?  A fetal movement count is the number of times that you feel your baby move during a certain amount of time. This may also be called a fetal kick count. A fetal movement count is recommended for every pregnant woman. You may be asked to start counting fetal movements as early as week 28 of your pregnancy.  Pay attention to when your baby is most active. You may notice your baby's sleep and wake cycles. You may also notice things that make your baby move more. You should do a fetal movement count:  · When your baby is normally most active.  · At the same time each day.    A good time to count movements is while you are resting, after having something to eat and drink.  How do I count fetal movements?  1. Find a quiet, comfortable area. Sit, or lie down on your side.  2. Write down the date, the start time and stop time, and the number of movements that you felt between those two times. Take this information with you to your health care visits.  3. For 2 hours, count kicks, flutters, swishes, rolls, and jabs. You should feel at least 10 movements during 2 hours.  4. You may stop counting after you have felt 10 movements.  5. If you do not feel 10 movements in 2 hours, have something to eat and drink. Then, keep resting and counting for 1 hour. If you feel at least 4 movements during that hour, you may stop counting.  Contact a health care provider if:  · You feel fewer than 4 movements in 2 hours.  · Your baby is not moving like he or she usually does.  Date: ____________ Start time: ____________ Stop time: ____________ Movements: ____________  Date: ____________ Start time: ____________ Stop time: ____________ Movements: ____________  Date: ____________ Start time: ____________ Stop time: ____________ Movements: ____________  Date: ____________ Start time:  ____________ Stop time: ____________ Movements: ____________  Date: ____________ Start time: ____________ Stop time: ____________ Movements: ____________  Date: ____________ Start time: ____________ Stop time: ____________ Movements: ____________  Date: ____________ Start time: ____________ Stop time: ____________ Movements: ____________  Date: ____________ Start time: ____________ Stop time: ____________ Movements: ____________  Date: ____________ Start time: ____________ Stop time: ____________ Movements: ____________  This information is not intended to replace advice given to you by your health care provider. Make sure you discuss any questions you have with your health care provider.  Document Released: 09/09/2006 Document Revised: 04/08/2016 Document Reviewed: 09/19/2015  Elsevier Interactive Patient Education © 2017 [employer]

## 2016-11-17 ENCOUNTER — Inpatient Hospital Stay (HOSPITAL_COMMUNITY): Payer: BLUE CROSS/BLUE SHIELD | Admitting: Anesthesiology

## 2016-11-17 ENCOUNTER — Encounter (HOSPITAL_COMMUNITY): Payer: Self-pay | Admitting: *Deleted

## 2016-11-17 ENCOUNTER — Inpatient Hospital Stay (HOSPITAL_COMMUNITY)
Admission: AD | Admit: 2016-11-17 | Discharge: 2016-11-19 | DRG: 775 | Disposition: A | Discharge status: Home / Self Care | Payer: BLUE CROSS/BLUE SHIELD | Source: Ambulatory Visit | Attending: Family Medicine | Admitting: Family Medicine

## 2016-11-17 ENCOUNTER — Ambulatory Visit (INDEPENDENT_AMBULATORY_CARE_PROVIDER_SITE_OTHER): Payer: BLUE CROSS/BLUE SHIELD | Admitting: Family Medicine

## 2016-11-17 ENCOUNTER — Encounter: Payer: Self-pay | Admitting: Family Medicine

## 2016-11-17 VITALS — BP 114/67 | HR 80 | Wt 230.1 lb

## 2016-11-17 DIAGNOSIS — N90810 Female genital mutilation status, unspecified: Secondary | ICD-10-CM | POA: Diagnosis present

## 2016-11-17 DIAGNOSIS — Z3493 Encounter for supervision of normal pregnancy, unspecified, third trimester: Secondary | ICD-10-CM | POA: Diagnosis present

## 2016-11-17 DIAGNOSIS — Z3A40 40 weeks gestation of pregnancy: Secondary | ICD-10-CM

## 2016-11-17 DIAGNOSIS — R8271 Bacteriuria: Secondary | ICD-10-CM

## 2016-11-17 DIAGNOSIS — O09523 Supervision of elderly multigravida, third trimester: Secondary | ICD-10-CM

## 2016-11-17 DIAGNOSIS — O99824 Streptococcus B carrier state complicating childbirth: Secondary | ICD-10-CM | POA: Diagnosis present

## 2016-11-17 DIAGNOSIS — Z3483 Encounter for supervision of other normal pregnancy, third trimester: Secondary | ICD-10-CM

## 2016-11-17 LAB — CBC
HCT: 32.3 % — ABNORMAL LOW (ref 36.0–46.0)
HEMOGLOBIN: 10.8 g/dL — AB (ref 12.0–15.0)
MCH: 29.3 pg (ref 26.0–34.0)
MCHC: 33.4 g/dL (ref 30.0–36.0)
MCV: 87.8 fL (ref 78.0–100.0)
PLATELETS: 148 10*3/uL — AB (ref 150–400)
RBC: 3.68 MIL/uL — ABNORMAL LOW (ref 3.87–5.11)
RDW: 15.7 % — AB (ref 11.5–15.5)
WBC: 9.6 10*3/uL (ref 4.0–10.5)

## 2016-11-17 LAB — TYPE AND SCREEN
ABO/RH(D): B POS
ANTIBODY SCREEN: NEGATIVE

## 2016-11-17 LAB — ABO/RH: ABO/RH(D): B POS

## 2016-11-17 MED ORDER — DIPHENHYDRAMINE HCL 50 MG/ML IJ SOLN: 12.5000 mg | INTRAMUSCULAR | Status: DC | PRN

## 2016-11-17 MED ORDER — LIDOCAINE HCL (PF) 1 % IJ SOLN
30.0000 mL | INTRAMUSCULAR | Status: DC | PRN
  Filled 2016-11-17: qty 30

## 2016-11-17 MED ORDER — FENTANYL CITRATE (PF) 100 MCG/2ML IJ SOLN: 100.0000 ug | INTRAMUSCULAR | Status: DC | PRN

## 2016-11-17 MED ORDER — SOD CITRATE-CITRIC ACID 500-334 MG/5ML PO SOLN: 30.0000 mL | ORAL | Status: DC | PRN

## 2016-11-17 MED ORDER — LACTATED RINGERS IV SOLN: 500.0000 mL | INTRAVENOUS | Status: DC | PRN

## 2016-11-17 MED ORDER — PHENYLEPHRINE 40 MCG/ML (10ML) SYRINGE FOR IV PUSH (FOR BLOOD PRESSURE SUPPORT)
80.0000 ug | PREFILLED_SYRINGE | INTRAVENOUS | Status: DC | PRN
  Filled 2016-11-17: qty 5

## 2016-11-17 MED ORDER — LACTATED RINGERS IV SOLN
INTRAVENOUS | Status: DC
  Administered 2016-11-17: 19:00:00 via INTRAVENOUS

## 2016-11-17 MED ORDER — EPHEDRINE 5 MG/ML INJ
10.0000 mg | INTRAVENOUS | Status: DC | PRN
  Filled 2016-11-17: qty 2

## 2016-11-17 MED ORDER — ONDANSETRON HCL 4 MG/2ML IJ SOLN
4.0000 mg | Freq: Four times a day (QID) | INTRAMUSCULAR | Status: DC | PRN
  Administered 2016-11-17: 4 mg via INTRAVENOUS
  Filled 2016-11-17: qty 2

## 2016-11-17 MED ORDER — OXYTOCIN 40 UNITS IN LACTATED RINGERS INFUSION - SIMPLE MED: 2.5000 [IU]/h | INTRAVENOUS | Status: DC

## 2016-11-17 MED ORDER — OXYTOCIN BOLUS FROM INFUSION
500.0000 mL | Freq: Once | INTRAVENOUS | Status: AC
  Administered 2016-11-17: 500 mL via INTRAVENOUS

## 2016-11-17 MED ORDER — DEXTROSE 5 % IV SOLN
5.0000 10*6.[IU] | Freq: Once | INTRAVENOUS | Status: AC
  Administered 2016-11-17: 5 10*6.[IU] via INTRAVENOUS
  Filled 2016-11-17 (×3): qty 5

## 2016-11-17 MED ORDER — PHENYLEPHRINE 40 MCG/ML (10ML) SYRINGE FOR IV PUSH (FOR BLOOD PRESSURE SUPPORT)
80.0000 ug | PREFILLED_SYRINGE | INTRAVENOUS | Status: DC | PRN
  Filled 2016-11-17: qty 5
  Filled 2016-11-17: qty 10

## 2016-11-17 MED ORDER — LACTATED RINGERS IV SOLN
500.0000 mL | Freq: Once | INTRAVENOUS | Status: AC
  Administered 2016-11-17: 500 mL via INTRAVENOUS

## 2016-11-17 MED ORDER — ACETAMINOPHEN 325 MG PO TABS: 650.0000 mg | ORAL_TABLET | ORAL | Status: DC | PRN

## 2016-11-17 MED ORDER — OXYCODONE-ACETAMINOPHEN 5-325 MG PO TABS: 1.0000 | ORAL_TABLET | ORAL | Status: DC | PRN

## 2016-11-17 MED ORDER — PENICILLIN G POT IN DEXTROSE 60000 UNIT/ML IV SOLN
3.0000 10*6.[IU] | INTRAVENOUS | Status: DC
  Administered 2016-11-17: 3 10*6.[IU] via INTRAVENOUS
  Filled 2016-11-17 (×5): qty 50

## 2016-11-17 MED ORDER — TERBUTALINE SULFATE 1 MG/ML IJ SOLN
0.2500 mg | Freq: Once | INTRAMUSCULAR | Status: DC | PRN
  Filled 2016-11-17: qty 1

## 2016-11-17 MED ORDER — OXYTOCIN 40 UNITS IN LACTATED RINGERS INFUSION - SIMPLE MED
1.0000 m[IU]/min | INTRAVENOUS | Status: DC
  Administered 2016-11-17: 2 m[IU]/min via INTRAVENOUS
  Filled 2016-11-17: qty 1000

## 2016-11-17 MED ORDER — FENTANYL 2.5 MCG/ML BUPIVACAINE 1/10 % EPIDURAL INFUSION (WH - ANES)
14.0000 mL/h | INTRAMUSCULAR | Status: DC | PRN
  Administered 2016-11-17: 14 mL/h via EPIDURAL
  Filled 2016-11-17: qty 100

## 2016-11-17 MED ORDER — OXYCODONE-ACETAMINOPHEN 5-325 MG PO TABS: 2.0000 | ORAL_TABLET | ORAL | Status: DC | PRN

## 2016-11-17 MED ORDER — LIDOCAINE HCL (PF) 1 % IJ SOLN
INTRAMUSCULAR | Status: DC | PRN
  Administered 2016-11-17: 13 mL via EPIDURAL

## 2016-11-17 MED ORDER — FLEET ENEMA 7-19 GM/118ML RE ENEM: 1.0000 | ENEMA | RECTAL | Status: DC | PRN

## 2016-11-17 NOTE — Anesthesia Preprocedure Evaluation (Signed)
Anesthesia Evaluation  Patient identified by MRN, date of birth, ID band Patient awake    Reviewed: Allergy & Precautions, NPO status , Patient's Chart, lab work & pertinent test results  Airway Mallampati: II  TM Distance: >3 FB Neck ROM: Full    Dental no notable dental hx.    Pulmonary neg pulmonary ROS,    Pulmonary exam normal breath sounds clear to auscultation       Cardiovascular negative cardio ROS Normal cardiovascular exam Rhythm:Regular Rate:Normal     Neuro/Psych negative neurological ROS  negative psych ROS   GI/Hepatic negative GI ROS, Neg liver ROS,   Endo/Other  negative endocrine ROS  Renal/GU negative Renal ROS  negative genitourinary   Musculoskeletal negative musculoskeletal ROS (+)   Abdominal   Peds negative pediatric ROS (+)  Hematology negative hematology ROS (+)   Anesthesia Other Findings   Reproductive/Obstetrics negative OB ROS (+) Pregnancy                             Anesthesia Physical Anesthesia Plan  ASA: II  Anesthesia Plan: Epidural   Post-op Pain Management:    Induction:   Airway Management Planned:   Additional Equipment:   Intra-op Plan:   Post-operative Plan:   Informed Consent:   Plan Discussed with:   Anesthesia Plan Comments:         Anesthesia Quick Evaluation  

## 2016-11-17 NOTE — Patient Instructions (Signed)
 Third Trimester of Pregnancy The third trimester is from week 28 through week 40 (months 7 through 9). The third trimester is a time when the unborn baby (fetus) is growing rapidly. At the end of the ninth month, the fetus is about 20 inches in length and weighs 6-10 pounds. Body changes during your third trimester Your body will continue to go through many changes during pregnancy. The changes vary from woman to woman. During the third trimester:  Your weight will continue to increase. You can expect to gain 25-35 pounds (11-16 kg) by the end of the pregnancy.  You may begin to get stretch marks on your hips, abdomen, and breasts.  You may urinate more often because the fetus is moving lower into your pelvis and pressing on your bladder.  You may develop or continue to have heartburn. This is caused by increased hormones that slow down muscles in the digestive tract.  You may develop or continue to have constipation because increased hormones slow digestion and cause the muscles that push waste through your intestines to relax.  You may develop hemorrhoids. These are swollen veins (varicose veins) in the rectum that can itch or be painful.  You may develop swollen, bulging veins (varicose veins) in your legs.  You may have increased body aches in the pelvis, back, or thighs. This is due to weight gain and increased hormones that are relaxing your joints.  You may have changes in your hair. These can include thickening of your hair, rapid growth, and changes in texture. Some women also have hair loss during or after pregnancy, or hair that feels dry or thin. Your hair will most likely return to normal after your baby is born.  Your breasts will continue to grow and they will continue to become tender. A yellow fluid (colostrum) may leak from your breasts. This is the first milk you are producing for your baby.  Your belly button may stick out.  You may notice more swelling in your  hands, face, or ankles.  You may have increased tingling or numbness in your hands, arms, and legs. The skin on your belly may also feel numb.  You may feel short of breath because of your expanding uterus.  You may have more problems sleeping. This can be caused by the size of your belly, increased need to urinate, and an increase in your body's metabolism.  You may notice the fetus "dropping," or moving lower in your abdomen (lightening).  You may have increased vaginal discharge.  You may notice your joints feel loose and you may have pain around your pelvic bone.  What to expect at prenatal visits You will have prenatal exams every 2 weeks until week 36. Then you will have weekly prenatal exams. During a routine prenatal visit:  You will be weighed to make sure you and the baby are growing normally.  Your blood pressure will be taken.  Your abdomen will be measured to track your baby's growth.  The fetal heartbeat will be listened to.  Any test results from the previous visit will be discussed.  You may have a cervical check near your due date to see if your cervix has softened or thinned (effaced).  You will be tested for Group B streptococcus. This happens between 35 and 37 weeks.  Your health care provider may ask you:  What your birth plan is.  How you are feeling.  If you are feeling the baby move.  If you have   had any abnormal symptoms, such as leaking fluid, bleeding, severe headaches, or abdominal cramping.  If you are using any tobacco products, including cigarettes, chewing tobacco, and electronic cigarettes.  If you have any questions.  Other tests or screenings that may be performed during your third trimester include:  Blood tests that check for low iron levels (anemia).  Fetal testing to check the health, activity level, and growth of the fetus. Testing is done if you have certain medical conditions or if there are problems during the  pregnancy.  Nonstress test (NST). This test checks the health of your baby to make sure there are no signs of problems, such as the baby not getting enough oxygen. During this test, a belt is placed around your belly. The baby is made to move, and its heart rate is monitored during movement.  What is false labor? False labor is a condition in which you feel small, irregular tightenings of the muscles in the womb (contractions) that usually go away with rest, changing position, or drinking water. These are called Braxton Hicks contractions. Contractions may last for hours, days, or even weeks before true labor sets in. If contractions come at regular intervals, become more frequent, increase in intensity, or become painful, you should see your health care provider. What are the signs of labor?  Abdominal cramps.  Regular contractions that start at 10 minutes apart and become stronger and more frequent with time.  Contractions that start on the top of the uterus and spread down to the lower abdomen and back.  Increased pelvic pressure and dull back pain.  A watery or bloody mucus discharge that comes from the vagina.  Leaking of amniotic fluid. This is also known as your "water breaking." It could be a slow trickle or a gush. Let your health care provider know if it has a color or strange odor. If you have any of these signs, call your health care provider right away, even if it is before your due date. Follow these instructions at home: Medicines  Follow your health care provider's instructions regarding medicine use. Specific medicines may be either safe or unsafe to take during pregnancy.  Take a prenatal vitamin that contains at least 600 micrograms (mcg) of folic acid.  If you develop constipation, try taking a stool softener if your health care provider approves. Eating and drinking  Eat a balanced diet that includes fresh fruits and vegetables, whole grains, good sources of protein  such as meat, eggs, or tofu, and low-fat dairy. Your health care provider will help you determine the amount of weight gain that is right for you.  Avoid raw meat and uncooked cheese. These carry germs that can cause birth defects in the baby.  If you have low calcium intake from food, talk to your health care provider about whether you should take a daily calcium supplement.  Eat four or five small meals rather than three large meals a day.  Limit foods that are high in fat and processed sugars, such as fried and sweet foods.  To prevent constipation: ? Drink enough fluid to keep your urine clear or pale yellow. ? Eat foods that are high in fiber, such as fresh fruits and vegetables, whole grains, and beans. Activity  Exercise only as directed by your health care provider. Most women can continue their usual exercise routine during pregnancy. Try to exercise for 30 minutes at least 5 days a week. Stop exercising if you experience uterine contractions.  Avoid   heavy lifting.  Do not exercise in extreme heat or humidity, or at high altitudes.  Wear low-heel, comfortable shoes.  Practice good posture.  You may continue to have sex unless your health care provider tells you otherwise. Relieving pain and discomfort  Take frequent breaks and rest with your legs elevated if you have leg cramps or low back pain.  Take warm sitz baths to soothe any pain or discomfort caused by hemorrhoids. Use hemorrhoid cream if your health care provider approves.  Wear a good support bra to prevent discomfort from breast tenderness.  If you develop varicose veins: ? Wear support pantyhose or compression stockings as told by your healthcare provider. ? Elevate your feet for 15 minutes, 3-4 times a day. Prenatal care  Write down your questions. Take them to your prenatal visits.  Keep all your prenatal visits as told by your health care provider. This is important. Safety  Wear your seat belt at  all times when driving.  Make a list of emergency phone numbers, including numbers for family, friends, the hospital, and police and fire departments. General instructions  Avoid cat litter boxes and soil used by cats. These carry germs that can cause birth defects in the baby. If you have a cat, ask someone to clean the litter box for you.  Do not travel far distances unless it is absolutely necessary and only with the approval of your health care provider.  Do not use hot tubs, steam rooms, or saunas.  Do not drink alcohol.  Do not use any products that contain nicotine or tobacco, such as cigarettes and e-cigarettes. If you need help quitting, ask your health care provider.  Do not use any medicinal herbs or unprescribed drugs. These chemicals affect the formation and growth of the baby.  Do not douche or use tampons or scented sanitary pads.  Do not cross your legs for long periods of time.  To prepare for the arrival of your baby: ? Take prenatal classes to understand, practice, and ask questions about labor and delivery. ? Make a trial run to the hospital. ? Visit the hospital and tour the maternity area. ? Arrange for maternity or paternity leave through employers. ? Arrange for family and friends to take care of pets while you are in the hospital. ? Purchase a rear-facing car seat and make sure you know how to install it in your car. ? Pack your hospital bag. ? Prepare the baby's nursery. Make sure to remove all pillows and stuffed animals from the baby's crib to prevent suffocation.  Visit your dentist if you have not gone during your pregnancy. Use a soft toothbrush to brush your teeth and be gentle when you floss. Contact a health care provider if:  You are unsure if you are in labor or if your water has broken.  You become dizzy.  You have mild pelvic cramps, pelvic pressure, or nagging pain in your abdominal area.  You have lower back pain.  You have persistent  nausea, vomiting, or diarrhea.  You have an unusual or bad smelling vaginal discharge.  You have pain when you urinate. Get help right away if:  Your water breaks before 37 weeks.  You have regular contractions less than 5 minutes apart before 37 weeks.  You have a fever.  You are leaking fluid from your vagina.  You have spotting or bleeding from your vagina.  You have severe abdominal pain or cramping.  You have rapid weight loss or weight   gain.  You have shortness of breath with chest pain.  You notice sudden or extreme swelling of your face, hands, ankles, feet, or legs.  Your baby makes fewer than 10 movements in 2 hours.  You have severe headaches that do not go away when you take medicine.  You have vision changes. Summary  The third trimester is from week 28 through week 40, months 7 through 9. The third trimester is a time when the unborn baby (fetus) is growing rapidly.  During the third trimester, your discomfort may increase as you and your baby continue to gain weight. You may have abdominal, leg, and back pain, sleeping problems, and an increased need to urinate.  During the third trimester your breasts will keep growing and they will continue to become tender. A yellow fluid (colostrum) may leak from your breasts. This is the first milk you are producing for your baby.  False labor is a condition in which you feel small, irregular tightenings of the muscles in the womb (contractions) that eventually go away. These are called Braxton Hicks contractions. Contractions may last for hours, days, or even weeks before true labor sets in.  Signs of labor can include: abdominal cramps; regular contractions that start at 10 minutes apart and become stronger and more frequent with time; watery or bloody mucus discharge that comes from the vagina; increased pelvic pressure and dull back pain; and leaking of amniotic fluid. This information is not intended to replace advice  given to you by your health care provider. Make sure you discuss any questions you have with your health care provider. Document Released: 08/04/2001 Document Revised: 01/16/2016 Document Reviewed: 10/11/2012 [employer] Interactive Patient Education  2017 [employer]   Breastfeeding Deciding to breastfeed is one of the best choices you can make for you and your baby. A change in hormones during pregnancy causes your breast tissue to grow and increases the number and size of your milk ducts. These hormones also allow proteins, sugars, and fats from your blood supply to make breast milk in your milk-producing glands. Hormones prevent breast milk from being released before your baby is born as well as prompt milk flow after birth. Once breastfeeding has begun, thoughts of your baby, as well as his or her sucking or crying, can stimulate the release of milk from your milk-producing glands. Benefits of breastfeeding For Your Baby  Your first milk (colostrum) helps your baby's digestive system function better.  There are antibodies in your milk that help your baby fight off infections.  Your baby has a lower incidence of asthma, allergies, and sudden infant death syndrome.  The nutrients in breast milk are better for your baby than infant formulas and are designed uniquely for your baby's needs.  Breast milk improves your baby's brain development.  Your baby is less likely to develop other conditions, such as childhood obesity, asthma, or type 2 diabetes mellitus.  For You  Breastfeeding helps to create a very special bond between you and your baby.  Breastfeeding is convenient. Breast milk is always available at the correct temperature and costs nothing.  Breastfeeding helps to burn calories and helps you lose the weight gained during pregnancy.  Breastfeeding makes your uterus contract to its prepregnancy size faster and slows bleeding (lochia) after you give birth.  Breastfeeding helps  to lower your risk of developing type 2 diabetes mellitus, osteoporosis, and breast or ovarian cancer later in life.  Signs that your baby is hungry Early Signs of Hunger    Increased alertness or activity.  Stretching.  Movement of the head from side to side.  Movement of the head and opening of the mouth when the corner of the mouth or cheek is stroked (rooting).  Increased sucking sounds, smacking lips, cooing, sighing, or squeaking.  Hand-to-mouth movements.  Increased sucking of fingers or hands.  Late Signs of Hunger  Fussing.  Intermittent crying.  Extreme Signs of Hunger Signs of extreme hunger will require calming and consoling before your baby will be able to breastfeed successfully. Do not wait for the following signs of extreme hunger to occur before you initiate breastfeeding:  Restlessness.  A loud, strong cry.  Screaming.  Breastfeeding basics Breastfeeding Initiation  Find a comfortable place to sit or lie down, with your neck and back well supported.  Place a pillow or rolled up blanket under your baby to bring him or her to the level of your breast (if you are seated). Nursing pillows are specially designed to help support your arms and your baby while you breastfeed.  Make sure that your baby's abdomen is facing your abdomen.  Gently massage your breast. With your fingertips, massage from your chest wall toward your nipple in a circular motion. This encourages milk flow. You may need to continue this action during the feeding if your milk flows slowly.  Support your breast with 4 fingers underneath and your thumb above your nipple. Make sure your fingers are well away from your nipple and your baby's mouth.  Stroke your baby's lips gently with your finger or nipple.  When your baby's mouth is open wide enough, quickly bring your baby to your breast, placing your entire nipple and as much of the colored area around your nipple (areola) as possible into  your baby's mouth. ? More areola should be visible above your baby's upper lip than below the lower lip. ? Your baby's tongue should be between his or her lower gum and your breast.  Ensure that your baby's mouth is correctly positioned around your nipple (latched). Your baby's lips should create a seal on your breast and be turned out (everted).  It is common for your baby to suck about 2-3 minutes in order to start the flow of breast milk.  Latching Teaching your baby how to latch on to your breast properly is very important. An improper latch can cause nipple pain and decreased milk supply for you and poor weight gain in your baby. Also, if your baby is not latched onto your nipple properly, he or she may swallow some air during feeding. This can make your baby fussy. Burping your baby when you switch breasts during the feeding can help to get rid of the air. However, teaching your baby to latch on properly is still the best way to prevent fussiness from swallowing air while breastfeeding. Signs that your baby has successfully latched on to your nipple:  Silent tugging or silent sucking, without causing you pain.  Swallowing heard between every 3-4 sucks.  Muscle movement above and in front of his or her ears while sucking.  Signs that your baby has not successfully latched on to nipple:  Sucking sounds or smacking sounds from your baby while breastfeeding.  Nipple pain.  If you think your baby has not latched on correctly, slip your finger into the corner of your baby's mouth to break the suction and place it between your baby's gums. Attempt breastfeeding initiation again. Signs of Successful Breastfeeding Signs from your baby:  A   gradual decrease in the number of sucks or complete cessation of sucking.  Falling asleep.  Relaxation of his or her body.  Retention of a small amount of milk in his or her mouth.  Letting go of your breast by himself or herself.  Signs from  you:  Breasts that have increased in firmness, weight, and size 1-3 hours after feeding.  Breasts that are softer immediately after breastfeeding.  Increased milk volume, as well as a change in milk consistency and color by the fifth day of breastfeeding.  Nipples that are not sore, cracked, or bleeding.  Signs That Your Baby is Getting Enough Milk  Wetting at least 1-2 diapers during the first 24 hours after birth.  Wetting at least 5-6 diapers every 24 hours for the first week after birth. The urine should be clear or pale yellow by 5 days after birth.  Wetting 6-8 diapers every 24 hours as your baby continues to grow and develop.  At least 3 stools in a 24-hour period by age 5 days. The stool should be soft and yellow.  At least 3 stools in a 24-hour period by age 7 days. The stool should be seedy and yellow.  No loss of weight greater than 10% of birth weight during the first 3 days of age.  Average weight gain of 4-7 ounces (113-198 g) per week after age 4 days.  Consistent daily weight gain by age 5 days, without weight loss after the age of 2 weeks.  After a feeding, your baby may spit up a small amount. This is common. Breastfeeding frequency and duration Frequent feeding will help you make more milk and can prevent sore nipples and breast engorgement. Breastfeed when you feel the need to reduce the fullness of your breasts or when your baby shows signs of hunger. This is called "breastfeeding on demand." Avoid introducing a pacifier to your baby while you are working to establish breastfeeding (the first 4-6 weeks after your baby is born). After this time you may choose to use a pacifier. Research has shown that pacifier use during the first year of a baby's life decreases the risk of sudden infant death syndrome (SIDS). Allow your baby to feed on each breast as long as he or she wants. Breastfeed until your baby is finished feeding. When your baby unlatches or falls asleep  while feeding from the first breast, offer the second breast. Because newborns are often sleepy in the first few weeks of life, you may need to awaken your baby to get him or her to feed. Breastfeeding times will vary from baby to baby. However, the following rules can serve as a guide to help you ensure that your baby is properly fed:  Newborns (babies 4 weeks of age or younger) may breastfeed every 1-3 hours.  Newborns should not go longer than 3 hours during the day or 5 hours during the night without breastfeeding.  You should breastfeed your baby a minimum of 8 times in a 24-hour period until you begin to introduce solid foods to your baby at around 6 months of age.  Breast milk pumping Pumping and storing breast milk allows you to ensure that your baby is exclusively fed your breast milk, even at times when you are unable to breastfeed. This is especially important if you are going back to work while you are still breastfeeding or when you are not able to be present during feedings. Your lactation consultant can give you guidelines on how   long it is safe to store breast milk. A breast pump is a machine that allows you to pump milk from your breast into a sterile bottle. The pumped breast milk can then be stored in a refrigerator or freezer. Some breast pumps are operated by hand, while others use electricity. Ask your lactation consultant which type will work best for you. Breast pumps can be purchased, but some hospitals and breastfeeding support groups lease breast pumps on a monthly basis. A lactation consultant can teach you how to hand express breast milk, if you prefer not to use a pump. Caring for your breasts while you breastfeed Nipples can become dry, cracked, and sore while breastfeeding. The following recommendations can help keep your breasts moisturized and healthy:  Avoid using soap on your nipples.  Wear a supportive bra. Although not required, special nursing bras and tank  tops are designed to allow access to your breasts for breastfeeding without taking off your entire bra or top. Avoid wearing underwire-style bras or extremely tight bras.  Air dry your nipples for 3-4minutes after each feeding.  Use only cotton bra pads to absorb leaked breast milk. Leaking of breast milk between feedings is normal.  Use lanolin on your nipples after breastfeeding. Lanolin helps to maintain your skin's normal moisture barrier. If you use pure lanolin, you do not need to wash it off before feeding your baby again. Pure lanolin is not toxic to your baby. You may also hand express a few drops of breast milk and gently massage that milk into your nipples and allow the milk to air dry.  In the first few weeks after giving birth, some women experience extremely full breasts (engorgement). Engorgement can make your breasts feel heavy, warm, and tender to the touch. Engorgement peaks within 3-5 days after you give birth. The following recommendations can help ease engorgement:  Completely empty your breasts while breastfeeding or pumping. You may want to start by applying warm, moist heat (in the shower or with warm water-soaked hand towels) just before feeding or pumping. This increases circulation and helps the milk flow. If your baby does not completely empty your breasts while breastfeeding, pump any extra milk after he or she is finished.  Wear a snug bra (nursing or regular) or tank top for 1-2 days to signal your body to slightly decrease milk production.  Apply ice packs to your breasts, unless this is too uncomfortable for you.  Make sure that your baby is latched on and positioned properly while breastfeeding.  If engorgement persists after 48 hours of following these recommendations, contact your health care provider or a lactation consultant. Overall health care recommendations while breastfeeding  Eat healthy foods. Alternate between meals and snacks, eating 3 of each per  day. Because what you eat affects your breast milk, some of the foods may make your baby more irritable than usual. Avoid eating these foods if you are sure that they are negatively affecting your baby.  Drink milk, fruit juice, and water to satisfy your thirst (about 10 glasses a day).  Rest often, relax, and continue to take your prenatal vitamins to prevent fatigue, stress, and anemia.  Continue breast self-awareness checks.  Avoid chewing and smoking tobacco. Chemicals from cigarettes that pass into breast milk and exposure to secondhand smoke may harm your baby.  Avoid alcohol and drug use, including marijuana. Some medicines that may be harmful to your baby can pass through breast milk. It is important to ask your health care   provider before taking any medicine, including all over-the-counter and prescription medicine as well as vitamin and herbal supplements. It is possible to become pregnant while breastfeeding. If birth control is desired, ask your health care provider about options that will be safe for your baby. Contact a health care provider if:  You feel like you want to stop breastfeeding or have become frustrated with breastfeeding.  You have painful breasts or nipples.  Your nipples are cracked or bleeding.  Your breasts are red, tender, or warm.  You have a swollen area on either breast.  You have a fever or chills.  You have nausea or vomiting.  You have drainage other than breast milk from your nipples.  Your breasts do not become full before feedings by the fifth day after you give birth.  You feel sad and depressed.  Your baby is too sleepy to eat well.  Your baby is having trouble sleeping.  Your baby is wetting less than 3 diapers in a 24-hour period.  Your baby has less than 3 stools in a 24-hour period.  Your baby's skin or the white part of his or her eyes becomes yellow.  Your baby is not gaining weight by 5 days of age. Get help right away  if:  Your baby is overly tired (lethargic) and does not want to wake up and feed.  Your baby develops an unexplained fever. This information is not intended to replace advice given to you by your health care provider. Make sure you discuss any questions you have with your health care provider. Document Released: 08/10/2005 Document Revised: 01/22/2016 Document Reviewed: 02/01/2013 Elsevier Interactive Patient Education  2017 Elsevier Inc.  

## 2016-11-17 NOTE — Anesthesia Pain Management Evaluation Note (Signed)
  CRNA Pain Management Visit Note  Patient: Zoe Gomez, 36 y.o., female  "Hello I am a member of the anesthesia team at [facility]. We have an anesthesia team available at all times to provide care throughout the hospital, including epidural management and anesthesia for C-section. I don't know your plan for the delivery whether it a natural birth, water birth, IV sedation, nitrous supplementation, doula or epidural, but we want to meet your pain goals."   1.Was your pain managed to your expectations on prior hospitalizations?   Yes   2.What is your expectation for pain management during this hospitalization?     Epidural  3.How can we help you reach that goal? unsure  Record the patient's initial score and the patient's pain goal.   Pain: 3  Pain Goal: 3 [facility] wants you to be able to say your pain was always managed very well.  Cephus ShellingBURGER,Jiyan Walkowski 11/17/2016

## 2016-11-17 NOTE — Anesthesia Procedure Notes (Signed)
Epidural Patient location during procedure: OB Start time: 11/17/2016 5:17 PM End time: 11/17/2016 5:33 PM  Staffing Anesthesiologist: Anitra LauthMILLER, Deyra Perdomo RAY Performed: anesthesiologist   Preanesthetic Checklist Completed: patient identified, site marked, surgical consent, pre-op evaluation, timeout performed, IV checked, risks and benefits discussed and monitors and equipment checked  Epidural Patient position: sitting Prep: DuraPrep Patient monitoring: heart rate, cardiac monitor, continuous pulse ox and blood pressure Approach: midline Location: L2-L3 Injection technique: LOR saline  Needle:  Needle type: Tuohy  Needle gauge: 17 G Needle length: 9 cm Needle insertion depth: 6 cm Catheter type: closed end flexible Catheter size: 20 Guage Catheter at skin depth: 9 cm Test dose: negative  Assessment Events: blood not aspirated, injection not painful, no injection resistance, negative IV test and no paresthesia  Additional Notes Reason for block:procedure for pain

## 2016-11-17 NOTE — Progress Notes (Signed)
   PRENATAL VISIT NOTE  Subjective:  Zoe Gomez is a 36 y.o. G3P2002 at 1215w2d being seen today for ongoing prenatal care.  She is currently monitored for the following issues for this low-risk pregnancy and has GBS bacteriuria; AMA (advanced maternal age) multigravida 35+, third trimester; Abnormal glucose affecting pregnancy; and Supervision of normal pregnancy in third trimester on her problem list.  Patient reports no complaints.  Contractions: Not present. Vag. Bleeding: Bloody Show.  Movement: Present. Denies leaking of fluid.   The following portions of the patient's history were reviewed and updated as appropriate: allergies, current medications, past family history, past medical history, past social history, past surgical history and problem list. Problem list updated.  Objective:   Vitals:   11/17/16 1013  BP: 114/67  Pulse: 80  Weight: 230 lb 1.6 oz (104.4 kg)    Fetal Status: Fetal Heart Rate (bpm): 144 Fundal Height: 38 cm Movement: Present  Presentation: Vertex  General:  Alert, oriented and cooperative. Patient is in no acute distress.  Skin: Skin is warm and dry. No rash noted.   Cardiovascular: Normal heart rate noted  Respiratory: Normal respiratory effort, no problems with respiration noted  Abdomen: Soft, gravid, appropriate for gestational age. Pain/Pressure: Absent     Pelvic:  Cervical exam deferred Dilation: 3.5 Effacement (%): 70 Station: -3  Extremities: Normal range of motion.  Edema: Trace  Mental Status: Normal mood and affect. Normal behavior. Normal judgment and thought content.   Assessment and Plan:  Pregnancy: G3P2002 at 8715w2d  1. Encounter for supervision of other normal pregnancy in third trimester Continue routine prenatal care. For IOL 3/30--trying to be home for Easter, multip with favorable cervix. For Pitocin. Has had NST/BPP 3/23--good until delivery. Membranes stripped.  2. AMA (advanced maternal age) multigravida 35+, third  trimester   Term labor symptoms and general obstetric precautions including but not limited to vaginal bleeding, contractions, leaking of fluid and fetal movement were reviewed in detail with the patient. Please refer to After Visit Summary for other counseling recommendations.  Return in 6 weeks (on 12/29/2016).   Reva Boresanya S Tywanda Rice, MD

## 2016-11-17 NOTE — H&P (Signed)
LABOR AND DELIVERY ADMISSION HISTORY AND PHYSICAL NOTE  Zoe Gomez is a 36 y.o. female 283P2002 with IUP at 6280w2d by LMP c/w US presenting for SOL.  Has had contractions for several days with small cervical change.  Had a clinic visit today with membranes stripped. Contraction frequency and intensity have increased since.    She reports positive fetal movement. She denies leakage of fluid or vaginal bleeding.  Prenatal History/Complications:  Past Medical History: Past Medical History:  Diagnosis Date  . Medical history non-contributory     Past Surgical History: Past Surgical History:  Procedure Laterality Date  . NO PAST SURGERIES      Obstetrical History: OB History    Gravida Para Term Preterm AB Living   3 2 2     2    SAB TAB Ectopic Multiple Live Births           2      Obstetric Comments   Last child born 2010. Largest prior 8lbs. G1 FAVD with epis due to FHT decel.       Social History: Social History   Social History  . Marital status: Married    Spouse name: N/A  . Number of children: N/A  . Years of education: N/A   Social History Main Topics  . Smoking status: Never Smoker  . Smokeless tobacco: Never Used  . Alcohol use No  . Drug use: No  . Sexual activity: Yes    Birth control/ protection: None   Other Topics Concern  . None   Social History Narrative  . None    Family History: History reviewed. No pertinent family history.  Allergies: No Known Allergies  Prescriptions Prior to Admission  Medication Sig Dispense Refill Last Dose  . omeprazole (PRILOSEC) 20 MG capsule Take 20 mg by mouth as needed.   11/16/2016 at Unknown time  . Prenatal Multivit-Min-Fe-FA (PRENATAL VITAMINS) 0.8 MG tablet Take 1 tablet by mouth daily. 30 tablet 12 11/16/2016 at Unknown time  . ranitidine (ZANTAC) 150 MG tablet Take 150 mg by mouth as needed for heartburn.   Past Week at Unknown time     Review of Systems   All systems reviewed and negative except  as stated in HPI  Blood pressure 124/85, pulse 83, temperature 98.7 F (37.1 C), temperature source Oral, resp. rate 16, last menstrual period 02/09/2016. General appearance: alert, cooperative, appears stated age and moderate distress Lungs: clear to auscultation bilaterally Heart: regular rate and rhythm Abdomen: soft, non-tender; bowel sounds normal Extremities: No calf swelling or tenderness Presentation: cephalic Fetal monitoring: cat I strip Uterine activity: q315min Dilation: 4.5 Effacement (%): 80 Station: -2 Exam by:: Exie ParodyA. Gagliardo, RN   Prenatal labs: ABO, Rh: B/Positive/-- (10/13 0000) Antibody: Negative (10/13 0000) Rubella: Immune RPR: Nonreactive (01/08 0000)  HBsAg: Negative (10/13 0000)  HIV: Non-reactive (01/08 0000)  GBS:   positive 3 hr Glucola: normal Genetic screening:  declined Anatomy US: normal  Prenatal Transfer Tool  Maternal Diabetes: No Genetic Screening: Declined Maternal Ultrasounds/Referrals: none Fetal Ultrasounds or other Referrals:  None Maternal Substance Abuse:  No Significant Maternal Medications:  None Significant Maternal Lab Results: None  No results found for this or any previous visit (from the past 24 hour(s)).  Patient Active Problem List   Diagnosis Date Noted  . AMA (advanced maternal age) multigravida 35+, third trimester 10/28/2016  . Abnormal glucose affecting pregnancy 10/28/2016  . Supervision of normal pregnancy in third trimester 10/28/2016  . GBS bacteriuria 05/24/2016  Assessment: Zoe Gomez is a 36 y.o. G3P2002 at [redacted]w[redacted]d here for SOL  #Labor: early active labor #Pain: Desires epidural #FWB: Cat I strip #ID: GBS positive #MOF: breast #MOC:OCP #Circ:  NA  Zoe Gomez 11/17/2016, 3:37 PM

## 2016-11-17 NOTE — MAU Note (Signed)
Pt started having contractions two hours ago, 3.5cm in the office.  Denies LOF/VB.

## 2016-11-18 LAB — CBC
HEMATOCRIT: 29.7 % — AB (ref 36.0–46.0)
HEMOGLOBIN: 10 g/dL — AB (ref 12.0–15.0)
MCH: 29.4 pg (ref 26.0–34.0)
MCHC: 33.7 g/dL (ref 30.0–36.0)
MCV: 87.4 fL (ref 78.0–100.0)
Platelets: 131 10*3/uL — ABNORMAL LOW (ref 150–400)
RBC: 3.4 MIL/uL — AB (ref 3.87–5.11)
RDW: 15.7 % — ABNORMAL HIGH (ref 11.5–15.5)
WBC: 11.3 10*3/uL — ABNORMAL HIGH (ref 4.0–10.5)

## 2016-11-18 LAB — RPR: RPR: NONREACTIVE

## 2016-11-18 MED ORDER — BENZOCAINE-MENTHOL 20-0.5 % EX AERO
1.0000 "application " | INHALATION_SPRAY | CUTANEOUS | Status: DC | PRN
  Filled 2016-11-18: qty 56

## 2016-11-18 MED ORDER — COCONUT OIL OIL: 1.0000 "application " | TOPICAL_OIL | Status: DC | PRN

## 2016-11-18 MED ORDER — ONDANSETRON HCL 4 MG/2ML IJ SOLN: 4.0000 mg | INTRAMUSCULAR | Status: DC | PRN

## 2016-11-18 MED ORDER — IBUPROFEN 600 MG PO TABS
600.0000 mg | ORAL_TABLET | Freq: Four times a day (QID) | ORAL | Status: DC
  Administered 2016-11-18 – 2016-11-19 (×6): 600 mg via ORAL
  Filled 2016-11-18 (×6): qty 1

## 2016-11-18 MED ORDER — WITCH HAZEL-GLYCERIN EX PADS: 1.0000 "application " | MEDICATED_PAD | CUTANEOUS | Status: DC | PRN

## 2016-11-18 MED ORDER — ACETAMINOPHEN 325 MG PO TABS
650.0000 mg | ORAL_TABLET | ORAL | Status: DC | PRN
  Administered 2016-11-18: 650 mg via ORAL
  Filled 2016-11-18: qty 2

## 2016-11-18 MED ORDER — ZOLPIDEM TARTRATE 5 MG PO TABS: 5.0000 mg | ORAL_TABLET | Freq: Every evening | ORAL | Status: DC | PRN

## 2016-11-18 MED ORDER — DIPHENHYDRAMINE HCL 25 MG PO CAPS: 25.0000 mg | ORAL_CAPSULE | Freq: Four times a day (QID) | ORAL | Status: DC | PRN

## 2016-11-18 MED ORDER — SENNOSIDES-DOCUSATE SODIUM 8.6-50 MG PO TABS
2.0000 | ORAL_TABLET | ORAL | Status: DC
  Administered 2016-11-18 – 2016-11-19 (×3): 2 via ORAL
  Filled 2016-11-18 (×3): qty 2

## 2016-11-18 MED ORDER — TETANUS-DIPHTH-ACELL PERTUSSIS 5-2.5-18.5 LF-MCG/0.5 IM SUSP: 0.5000 mL | Freq: Once | INTRAMUSCULAR | Status: DC

## 2016-11-18 MED ORDER — DIBUCAINE 1 % RE OINT: 1.0000 "application " | TOPICAL_OINTMENT | RECTAL | Status: DC | PRN

## 2016-11-18 MED ORDER — SIMETHICONE 80 MG PO CHEW
80.0000 mg | CHEWABLE_TABLET | ORAL | Status: DC | PRN
  Administered 2016-11-18: 80 mg via ORAL
  Filled 2016-11-18: qty 1

## 2016-11-18 MED ORDER — ONDANSETRON HCL 4 MG PO TABS: 4.0000 mg | ORAL_TABLET | ORAL | Status: DC | PRN

## 2016-11-18 MED ORDER — PRENATAL MULTIVITAMIN CH
1.0000 | ORAL_TABLET | Freq: Every day | ORAL | Status: DC
  Administered 2016-11-18 – 2016-11-19 (×2): 1 via ORAL
  Filled 2016-11-18 (×2): qty 1

## 2016-11-18 NOTE — Progress Notes (Signed)
Post Partum Day #1 Subjective: no complaints, up ad lib, voiding, tolerating PO and reports normal lochia  Objective: Blood pressure (!) 106/56, pulse 74, temperature 99.2 F (37.3 Zoe), temperature source Oral, resp. rate 17, height 5\' 10"  (1.778 m), weight 104.3 kg (230 lb), last menstrual period 02/09/2016, SpO2 100 %, unknown if currently breastfeeding.  Physical Exam:  General: alert Lochia: appropriate Uterine Fundus: firm and NT at U-1 DVT Evaluation: No evidence of DVT seen on physical exam.   Recent Labs  11/17/16 1605 11/18/16 0600  HGB 10.8* 10.0*  HCT 32.3* 29.7*    Assessment/Plan: Plan for discharge tomorrow   LOS: 1 day   Zoe Gomez Zoe Gomez 11/18/2016, 8:13 AM

## 2016-11-18 NOTE — Lactation Note (Signed)
This note was copied from a baby's chart. Lactation Consultation Note  Patient Name: [name] HYQMV'HToday's Date: 11/18/2016 Reason for consult: Initial assessment  Baby 12 hours old. Mom reports that she nursed both her 2 older children over 9 months. Mom states this baby nursing well. Enc mom to call for assistance as needed.   Maternal Data Has patient been taught Hand Expression?: Yes Does the patient have breastfeeding experience prior to this delivery?: Yes  Feeding    LATCH Score/Interventions                      Lactation Tools Discussed/Used     Consult Status Consult Status: PRN    Sherlyn HayJennifer D Riyan Haile 11/18/2016, 11:09 AM

## 2016-11-18 NOTE — Anesthesia Postprocedure Evaluation (Signed)
Anesthesia Post Note  Patient: Zoe Gomez  Procedure(s) Performed: * No procedures listed *  Patient location during evaluation: Mother Baby Anesthesia Type: Epidural Level of consciousness: awake and awake and alert Pain management: pain level controlled Vital Signs Assessment: post-procedure vital signs reviewed and stable Respiratory status: spontaneous breathing Cardiovascular status: stable Postop Assessment: no headache, epidural receding and patient able to bend at knees Anesthetic complications: no Comments: Pt states she has some back ache on right side of back extending into right hip area;no numbness remaining from epidural and no other complaints        Last Vitals:  Vitals:   11/18/16 0114 11/18/16 0551  BP: (!) 118/57 (!) 106/56  Pulse: 82 74  Resp: 18 17  Temp: 36.9 C 37.3 C    Last Pain:  Vitals:   11/18/16 0551  TempSrc: Oral  PainSc: 0-No pain   Pain Goal: Patients Stated Pain Goal: 3 (11/17/16 1739)               Edison PaceWILKERSON,Allisson Schindel

## 2016-11-19 LAB — GC/CHLAMYDIA PROBE AMP (~~LOC~~) NOT AT ARMC
Chlamydia: NEGATIVE
NEISSERIA GONORRHEA: NEGATIVE

## 2016-11-19 MED ORDER — IBUPROFEN 600 MG PO TABS: 600.0000 mg | ORAL_TABLET | Freq: Four times a day (QID) | ORAL | 0 refills | Status: DC | PRN

## 2016-11-19 MED ORDER — NORETHINDRONE 0.35 MG PO TABS: 1.0000 | ORAL_TABLET | Freq: Every day | ORAL | 11 refills | Status: DC

## 2016-11-19 NOTE — Discharge Instructions (Signed)

## 2016-11-19 NOTE — Discharge Summary (Signed)
OB Discharge Summary  Patient Name: Zoe Gomez DOB: Dec 09, 1980 MRN: [id number]  Date of admission: 11/17/2016 Delivering MD: Clyda Greener   Date of discharge: 11/19/2016  Admitting diagnosis: 41WLKS LABOR Intrauterine pregnancy: [redacted]w[redacted]d     Secondary diagnosis:Active Problems:   Normal labor     Discharge diagnosis: Term Pregnancy Delivered                                                                      Complications: None  Hospital course:  Onset of Labor With Vaginal Delivery     36 y.o. yo G3P3003 at [redacted]w[redacted]d was admitted in Active Labor on 11/17/2016. Patient had an uncomplicated labor course as follows:  Membrane Rupture Time/Date: 7:35 PM ,11/17/2016   Intrapartum Procedures: Episiotomy: None [1]                                         Lacerations:  1st degree [2];Perineal [11]  Patient had a delivery of a Viable infant. 11/17/2016  Information for the patient's newborn:  Nicoya, Friel [facility] [[id number]]       Pateint had an uncomplicated postpartum course.  She is ambulating, tolerating a regular diet, passing flatus, and urinating well. Patient is discharged home in stable condition on 11/19/16.   Physical exam  Vitals:   11/18/16 0900 11/18/16 1300 11/18/16 1840 11/19/16 0500  BP:  105/89 120/68 (!) 104/54  Pulse:  77 73 60  Resp:  18 18 16   Temp: 98 F (36.7 C) 98.3 F (36.8 C) 98.5 F (36.9 C) 98.2 F (36.8 C)  TempSrc: Oral Oral Oral Oral  SpO2:  99%    Weight:      Height:       General: alert Lochia: appropriate Uterine Fundus: firm and NT at U-1 DVT Evaluation: No evidence of DVT seen on physical exam. Labs: Lab Results  Component Value Date   WBC 11.3 (H) 11/18/2016   HGB 10.0 (L) 11/18/2016   HCT 29.7 (L) 11/18/2016   MCV 87.4 11/18/2016   PLT 131 (L) 11/18/2016   No flowsheet data found.  Discharge instruction: per After Visit Summary and "Baby and Me Booklet".  After Visit Meds:  Allergies as of 11/19/2016   No Known  Allergies     Medication List    TAKE these medications   norethindrone 0.35 MG tablet Commonly known as:  MICRONOR,CAMILA,ERRIN Take 1 tablet (0.35 mg total) by mouth daily. Start pills today Rec abstinence until after postpartum visit   omeprazole 20 MG capsule Commonly known as:  PRILOSEC Take 20 mg by mouth as needed.   Prenatal Vitamins 0.8 MG tablet Take 1 tablet by mouth daily.   ranitidine 150 MG tablet Commonly known as:  ZANTAC Take 150 mg by mouth as needed for heartburn.       Diet: routine diet  Activity: Advance as tolerated. Pelvic rest for 6 weeks.   Outpatient follow up:6 weeks Follow up Appt:No future appointments. Follow up visit: No Follow-up on file.  Postpartum contraception: Progesterone only pills  Newborn Data: Live born female  Birth Weight: 8 lb 2.5 oz (3700  g) APGAR: 8, 9  Baby Feeding: Breast Disposition:home with mother   11/19/2016 Allie BossierMyra C Yandell Mcjunkins, MD

## 2016-11-19 NOTE — Lactation Note (Signed)
This note was copied from a baby's chart. Lactation Consultation Note  P3, Baby 34 hours old.  Mother wants to give formula and some bf until her milk supply transitions. Discussed supply and demand.  Encouraged her to bf before offering formula. Suggest hand expression before feedings and often. Mom encouraged to feed baby 8-12 times/24 hours and with feeding cues.  Reviewed engorgement care and monitoring voids/stools. Mother denies problems or questions.  Patient Name: Girl Almeta MonasFirdouse Casanas ZOXWR'UToday's Date: 11/19/2016 Reason for consult: Follow-up assessment   Maternal Data    Feeding Feeding Type: Breast Fed Length of feed: 10 min  LATCH Score/Interventions                      Lactation Tools Discussed/Used     Consult Status Consult Status: Complete    Hardie PulleyBerkelhammer, Yancy Knoble Boschen 11/19/2016, 8:47 AM

## 2016-11-21 ENCOUNTER — Inpatient Hospital Stay (HOSPITAL_COMMUNITY): Admission: RE | Admit: 2016-11-21 | Payer: BLUE CROSS/BLUE SHIELD | Source: Ambulatory Visit

## 2016-11-26 ENCOUNTER — Telehealth: Payer: Self-pay

## 2016-11-26 NOTE — Telephone Encounter (Signed)
Received message from patient regarding having a smelly discharge and some pelvic pain the last couple of days. Please advised

## 2016-12-01 ENCOUNTER — Encounter: Payer: Self-pay | Admitting: Student

## 2016-12-03 NOTE — Telephone Encounter (Signed)
Called patient, no answer- left message that we are trying to reach you to return your phone call, please call us back so we can assist you.

## 2016-12-23 ENCOUNTER — Encounter: Payer: Self-pay | Admitting: Student

## 2016-12-23 ENCOUNTER — Ambulatory Visit: Payer: BLUE CROSS/BLUE SHIELD | Admitting: Student

## 2016-12-23 ENCOUNTER — Ambulatory Visit (INDEPENDENT_AMBULATORY_CARE_PROVIDER_SITE_OTHER): Payer: BLUE CROSS/BLUE SHIELD | Admitting: Student

## 2016-12-23 MED ORDER — NORETHINDRONE 0.35 MG PO TABS: 1.0000 | ORAL_TABLET | Freq: Every day | ORAL | 11 refills | Status: DC

## 2016-12-23 NOTE — Patient Instructions (Signed)
Oral Contraception Information Oral contraceptive pills (OCPs) are medicines taken to prevent pregnancy. OCPs work by preventing the ovaries from releasing eggs. The hormones in OCPs also cause the cervical mucus to thicken, preventing the sperm from entering the uterus. The hormones also cause the uterine lining to become thin, not allowing a fertilized egg to attach to the inside of the uterus. OCPs are highly effective when taken exactly as prescribed. However, OCPs do not prevent sexually transmitted diseases (STDs). Safe sex practices, such as using condoms along with the pill, can help prevent STDs. Before taking the pill, you may have a physical exam and Pap test. Your health care provider may order blood tests. The health care provider will make sure you are a good candidate for oral contraception. Discuss with your health care provider the possible side effects of the OCP you may be prescribed. When starting an OCP, it can take 2 to 3 months for the body to adjust to the changes in hormone levels in your body. Types of oral contraception  The combination pill-This pill contains estrogen and progestin (synthetic progesterone) hormones. The combination pill comes in 21-day, 28-day, or 91-day packs. Some types of combination pills are meant to be taken continuously (365-day pills). With 21-day packs, you do not take pills for 7 days after the last pill. With 28-day packs, the pill is taken every day. The last 7 pills are without hormones. Certain types of pills have more than 21 hormone-containing pills. With 91-day packs, the first 84 pills contain both hormones, and the last 7 pills contain no hormones or contain estrogen only.  The minipill-This pill contains the progesterone hormone only. The pill is taken every day continuously. It is very important to take the pill at the same time each day. The minipill comes in packs of 28 pills. All 28 pills contain the hormone. Advantages of oral  contraceptive pills  Decreases premenstrual symptoms.  Treats menstrual period cramps.  Regulates the menstrual cycle.  Decreases a heavy menstrual flow.  May treatacne, depending on the type of pill.  Treats abnormal uterine bleeding.  Treats polycystic ovarian syndrome.  Treats endometriosis.  Can be used as emergency contraception. Things that can make oral contraceptive pills less effective OCPs can be less effective if:  You forget to take the pill at the same time every day.  You have a stomach or intestinal disease that lessens the absorption of the pill.  You take OCPs with other medicines that make OCPs less effective, such as antibiotics, certain HIV medicines, and some seizure medicines.  You take expired OCPs.  You forget to restart the pill on day 7, when using the packs of 21 pills. Risks associated with oral contraceptive pills Oral contraceptive pills can sometimes cause side effects, such as:  Headache.  Nausea.  Breast tenderness.  Irregular bleeding or spotting. Combination pills are also associated with a small increased risk of:  Blood clots.  Heart attack.  Stroke. This information is not intended to replace advice given to you by your health care provider. Make sure you discuss any questions you have with your health care provider. Document Released: 10/31/2002 Document Revised: 01/16/2016 Document Reviewed: 01/29/2013 Elsevier Interactive Patient Education  2017 Elsevier Inc.  

## 2016-12-23 NOTE — Progress Notes (Signed)
Subjective:     Zoe Gomez is a 36 y.o. female who presents for a postpartum visit. She is 5 weeks postpartum following a spontaneous vaginal delivery. I have fully reviewed the prenatal and intrapartum course. The delivery was at 41 gestational weeks. Outcome: spontaneous vaginal delivery. Anesthesia: epidural. Postpartum course has been uneventful. Baby's course has been uneventful. Baby is feeding by breast. Bleeding no bleeding. Bowel function is normal. Bladder function is normal. Patient is not sexually active. Contraception method is none. Postpartum depression screening: patient declines, says she is doing.  The following portions of the patient's history were reviewed and updated as appropriate: allergies, current medications, past family history, past medical history, past social history, past surgical history and problem list.  Review of Systems Pertinent items are noted in HPI.   Objective:    BP 118/61   Pulse 93   Ht  (1.753 m)   Wt 222 lb 1.6 oz (100.7 kg)   Breastfeeding? Yes   BMI 32.80 kg/m   General:  alert, cooperative and no distress   Breasts:  inspection negative, no nipple discharge or bleeding, no masses or nodularity palpable  Lungs: clear to auscultation bilaterally  Heart:  regular rate and rhythm, S1, S2 normal, no murmur, click, rub or gallop  Abdomen: soft, non-tender; bowel sounds normal; no masses,  no organomegaly   Vulva:  not evaluated  Vagina: not evaluated  Cervix:  not evaluated  Corpus: not examined  Adnexa:  not evaluated  Rectal Exam: Not performed.        Assessment:    Normal postpartum exam. Pap smear not done at today's visit.  Pap was normal per patient at [facility] 18 months ago.  Plan:    1. Contraception: oral progesterone-only contraceptive 2. Well -woman exam 3. Follow up in: 1 year or as needed.    Luna Kitchens

## 2017-06-21 ENCOUNTER — Ambulatory Visit (INDEPENDENT_AMBULATORY_CARE_PROVIDER_SITE_OTHER): Payer: BLUE CROSS/BLUE SHIELD | Admitting: Advanced Practice Midwife

## 2017-06-21 ENCOUNTER — Encounter: Payer: Self-pay | Admitting: Advanced Practice Midwife

## 2017-06-21 VITALS — BP 87/56 | HR 91 | Wt 224.0 lb

## 2017-06-21 DIAGNOSIS — Z30018 Encounter for initial prescription of other contraceptives: Secondary | ICD-10-CM

## 2017-06-21 DIAGNOSIS — Z3202 Encounter for pregnancy test, result negative: Secondary | ICD-10-CM

## 2017-06-21 DIAGNOSIS — Z3049 Encounter for surveillance of other contraceptives: Secondary | ICD-10-CM

## 2017-06-21 DIAGNOSIS — Z3009 Encounter for other general counseling and advice on contraception: Secondary | ICD-10-CM

## 2017-06-21 LAB — POCT URINE PREGNANCY: PREG TEST UR: NEGATIVE

## 2017-06-21 MED ORDER — ETONOGESTREL-ETHINYL ESTRADIOL 0.12-0.015 MG/24HR VA RING: VAGINAL_RING | VAGINAL | 12 refills | Status: AC

## 2017-06-21 NOTE — Progress Notes (Signed)
GYNECOLOGY CLINIC ANNUAL PREVENTATIVE CARE ENCOUNTER NOTE  Subjective:   Zoe Gomez is a 36 y.o. [id number] female here for change in contraception.  She delivered 7 months ago and is breastfeeding.  Wants to use Nuvaring.  Discussed possibility that estrogen may diminish milk supply, but pt wants to try it and see.  If it does, she will switch back to minipills.    Current complaints: none.   Denies abnormal vaginal bleeding, discharge, pelvic pain, problems with intercourse or other gynecologic concerns.    Gynecologic History Patient's last menstrual period was 02/10/2016. Contraception: oral progesterone-only contraceptive Last Pap: states was done by family doctor last year and was normal  Obstetric History OB History  Gravida Para Term Preterm AB Living  3 3 3     3   SAB TAB Ectopic Multiple Live Births        0 3    # Outcome Date GA Lbr Len/2nd Weight Sex Delivery Anes PTL Lv  3 Term 11/17/16 [redacted]w[redacted]d 124:17 / 00:26 8 lb 2.5 oz (3.7 kg) F Vag-Spont EPI  LIV  2 Term     F Vag-Spont   LIV  1 Term     F Vag-Spont   LIV    Obstetric Comments  Last child born 2010. Largest prior 8lbs. G1 FAVD with epis due to FHT decel.     Past Medical History:  Diagnosis Date  . Medical history non-contributory     Past Surgical History:  Procedure Laterality Date  . NO PAST SURGERIES      Current Outpatient Prescriptions on File Prior to Visit  Medication Sig Dispense Refill  . norethindrone (MICRONOR,CAMILA,ERRIN) 0.35 MG tablet Take 1 tablet (0.35 mg total) by mouth daily. Start pills today Rec abstinence until after postpartum visit 1 Package 11  . Prenatal Multivit-Min-Fe-FA (PRENATAL VITAMINS) 0.8 MG tablet Take 1 tablet by mouth daily. (Patient not taking: Reported on 06/21/2017) 30 tablet 12   No current facility-administered medications on file prior to visit.     No Known Allergies  Social History   Social History  . Marital status: Married    Spouse name: N/A  .  Number of children: N/A  . Years of education: N/A   Occupational History  . Not on file.   Social History Main Topics  . Smoking status: Never Smoker  . Smokeless tobacco: Never Used  . Alcohol use No  . Drug use: No  . Sexual activity: Yes    Birth control/ protection: Pill   Other Topics Concern  . Not on file   Social History Narrative  . No narrative on file    History reviewed. No pertinent family history.  The following portions of the patient's history were reviewed and updated as appropriate: allergies, current medications, past family history, past medical history, past social history, past surgical history and problem list.  Review of Systems Pertinent items are noted in HPI.   Objective:  BP (!) 87/56   Pulse 91   Wt 224 lb (101.6 kg)   LMP 02/10/2016   Breastfeeding? Yes   BMI 33.08 kg/m  CONSTITUTIONAL: Well-developed, well-nourished female in no acute distress.  SKIN: Skin is warm and dry. Marland Kitchen NEUROLGIC: Alert and oriented to person, place, and time.  PSYCHIATRIC: Normal mood and affect. Normal behavior. Normal judgment and thought content. CARDIOVASCULAR: Normal heart rate noted, regular rhythm RESPIRATORY: Clear to auscultation bilaterally. Effort and breath sounds normal, no problems with respiration noted. BREASTS: Symmetric in size. No masses,  skin changes, nipple drainage, or lymphadenopathy. ABDOMEN: Soft, normal bowel sounds, no distention noted.  No tenderness, rebound or guarding.  PELVIC: Deferred per patient.   Assessment:  Postpartum x 7 months Desire to start Nuvaring   Plan:  Rx Nuvaring x 1 year.  Discussed risk of diminishing milk supply .  Pt wants to try it anyway and will switch back if necessary.     Please refer to After Visit Summary for other counseling recommendations.

## 2017-06-21 NOTE — Patient Instructions (Signed)
Ethinyl Estradiol; Etonogestrel vaginal ring What is this medicine? ETHINYL ESTRADIOL; ETONOGESTREL (ETH in il es tra DYE ole; et oh noe JES trel) vaginal ring is a flexible, vaginal ring used as a contraceptive (birth control method). This medicine combines two types of female hormones, an estrogen and a progestin. This ring is used to prevent ovulation and pregnancy. Each ring is effective for one month. This medicine may be used for other purposes; ask your health care provider or pharmacist if you have questions. COMMON BRAND NAME(S): NuvaRing What should I tell my health care provider before I take this medicine? They need to know if you have or ever had any of these conditions: -abnormal vaginal bleeding -blood vessel disease or blood clots -breast, cervical, endometrial, ovarian, liver, or uterine cancer -diabetes -gallbladder disease -heart disease or recent heart attack -high blood pressure -high cholesterol -kidney disease -liver disease -migraine headaches -stroke -systemic lupus erythematosus (SLE) -tobacco smoker -an unusual or allergic reaction to estrogens, progestins, other medicines, foods, dyes, or preservatives -pregnant or trying to get pregnant -breast-feeding How should I use this medicine? Insert the ring into your vagina as directed. Follow the directions on the prescription label. The ring will remain place for 3 weeks and is then removed for a 1-week break. A new ring is inserted 1 week after the last ring was removed, on the same day of the week. Check often to make sure the ring is still in place, especially before and after sexual intercourse. If the ring was out of the vagina for an unknown amount of time, you may not be protected from pregnancy. Perform a pregnancy test and call your doctor. Do not use more often than directed. A patient package insert for the product will be given with each prescription and refill. Read this sheet carefully each time. The  sheet may change frequently. Contact your pediatrician regarding the use of this medicine in children. Special care may be needed. This medicine has been used in female children who have started having menstrual periods. Overdosage: If you think you have taken too much of this medicine contact a poison control center or emergency room at once. NOTE: This medicine is only for you. Do not share this medicine with others. What if I miss a dose? You will need to replace your vaginal ring once a month as directed. If the ring should slip out, or if you leave it in longer or shorter than you should, contact your health care professional for advice. What may interact with this medicine? Do not take this medicine with the following medication: -dasabuvir; ombitasvir; paritaprevir; ritonavir -ombitasvir; paritaprevir; ritonavir This medicine may also interact with the following medications: -acetaminophen -antibiotics or medicines for infections, especially rifampin, rifabutin, rifapentine, and griseofulvin, and possibly penicillins or tetracyclines -aprepitant -ascorbic acid (vitamin C) -atorvastatin -barbiturate medicines, such as phenobarbital -bosentan -carbamazepine -caffeine -clofibrate -cyclosporine -dantrolene -doxercalciferol -felbamate -grapefruit juice -hydrocortisone -medicines for anxiety or sleeping problems, such as diazepam or temazepam -medicines for diabetes, including pioglitazone -modafinil -mycophenolate -nefazodone -oxcarbazepine -phenytoin -prednisolone -ritonavir or other medicines for HIV infection or AIDS -rosuvastatin -selegiline -soy isoflavones supplements -St. John's wort -tamoxifen or raloxifene -theophylline -thyroid hormones -topiramate -warfarin This list may not describe all possible interactions. Give your health care provider a list of all the medicines, herbs, non-prescription drugs, or dietary supplements you use. Also tell them if you smoke,  drink alcohol, or use illegal drugs. Some items may interact with your medicine. What should I watch for while using   this medicine? Visit your doctor or health care professional for regular checks on your progress. You will need a regular breast and pelvic exam and Pap smear while on this medicine. Use an additional method of contraception during the first cycle that you use this ring. Do not use a diaphragm or female condom, as the ring can interfere with these birth control methods and their proper placement. If you have any reason to think you are pregnant, stop using this medicine right away and contact your doctor or health care professional. If you are using this medicine for hormone related problems, it may take several cycles of use to see improvement in your condition. Smoking increases the risk of getting a blood clot or having a stroke while you are using hormonal birth control, especially if you are more than 37 years old. You are strongly advised not to smoke. This medicine can make your body retain fluid, making your fingers, hands, or ankles swell. Your blood pressure can go up. Contact your doctor or health care professional if you feel you are retaining fluid. This medicine can make you more sensitive to the sun. Keep out of the sun. If you cannot avoid being in the sun, wear protective clothing and use sunscreen. Do not use sun lamps or tanning beds/booths. If you wear contact lenses and notice visual changes, or if the lenses begin to feel uncomfortable, consult your eye care specialist. In some women, tenderness, swelling, or minor bleeding of the gums may occur. Notify your dentist if this happens. Brushing and flossing your teeth regularly may help limit this. See your dentist regularly and inform your dentist of the medicines you are taking. If you are going to have elective surgery, you may need to stop using this medicine before the surgery. Consult your health care professional  for advice. This medicine does not protect you against HIV infection (AIDS) or any other sexually transmitted diseases. What side effects may I notice from receiving this medicine? Side effects that you should report to your doctor or health care professional as soon as possible: -breast tissue changes or discharge -changes in vaginal bleeding during your period or between your periods -chest pain -coughing up blood -dizziness or fainting spells -headaches or migraines -leg, arm or groin pain -severe or sudden headaches -stomach pain (severe) -sudden shortness of breath -sudden loss of coordination, especially on one side of the body -speech problems -symptoms of vaginal infection like itching, irritation or unusual discharge -tenderness in the upper abdomen -vomiting -weakness or numbness in the arms or legs, especially on one side of the body -yellowing of the eyes or skin Side effects that usually do not require medical attention (report to your doctor or health care professional if they continue or are bothersome): -breakthrough bleeding and spotting that continues beyond the 3 initial cycles of pills -breast tenderness -mood changes, anxiety, depression, frustration, anger, or emotional outbursts -increased sensitivity to sun or ultraviolet light -nausea -skin rash, acne, or brown spots on the skin -weight gain (slight) This list may not describe all possible side effects. Call your doctor for medical advice about side effects. You may report side effects to FDA at 1-800-FDA-1088. Where should I keep my medicine? Keep out of the reach of children. Store at room temperature between 15 and 30 degrees C (59 and 86 degrees F) for up to 4 months. The product will expire after 4 months. Protect from light. Throw away any unused medicine after the expiration date. NOTE: This   sheet is a summary. It may not cover all possible information. If you have questions about this medicine, talk  to your doctor, pharmacist, or health care provider.  2018 Elsevier/Gold Standard (2016-04-17 17:00:31)  

## 2017-06-22 LAB — POCT PREGNANCY, URINE: Preg Test, Ur: NEGATIVE

## 2017-11-06 ENCOUNTER — Other Ambulatory Visit: Payer: Self-pay | Admitting: Student

## 2018-02-15 LAB — T-SPOT TB TEST

## 2018-02-15 IMAGING — US US MFM FETAL BPP W/O NON-STRESS
1 series · 12 of 16 positions shown · non-contrast
Comparison: none

[Series 1: us mfm fetal bpp w/o non-stress · 16 acquisitions, 12 frames shown]
[im 1/16]
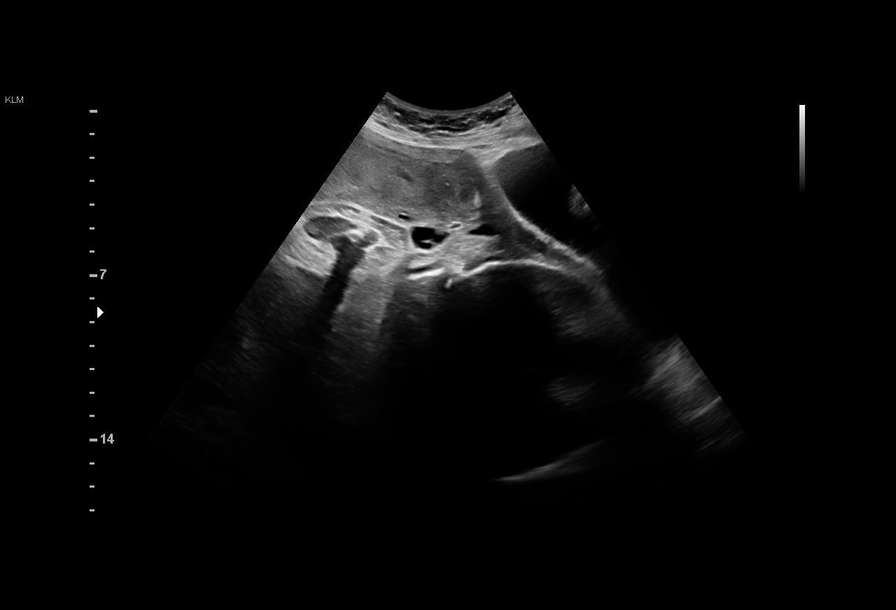
[im 3/16]
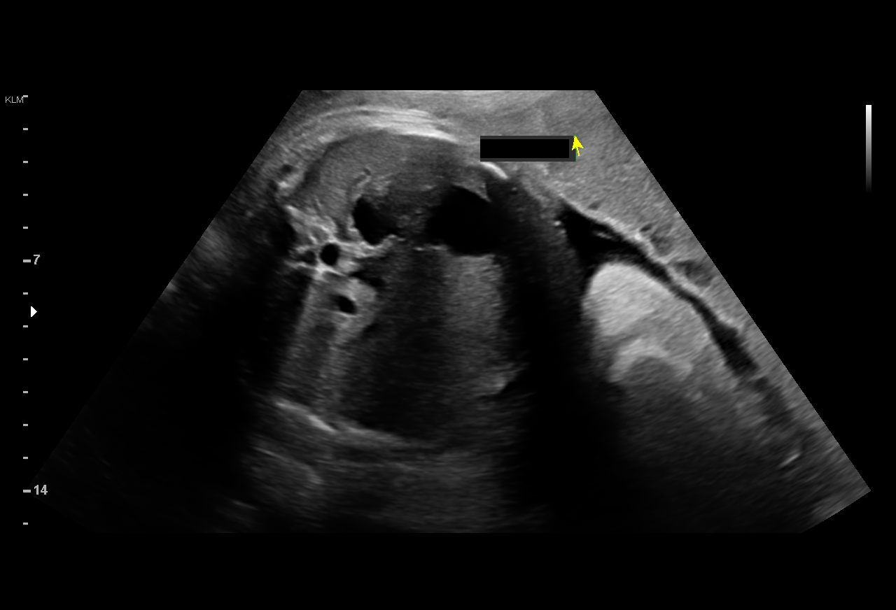
[im 4/16]
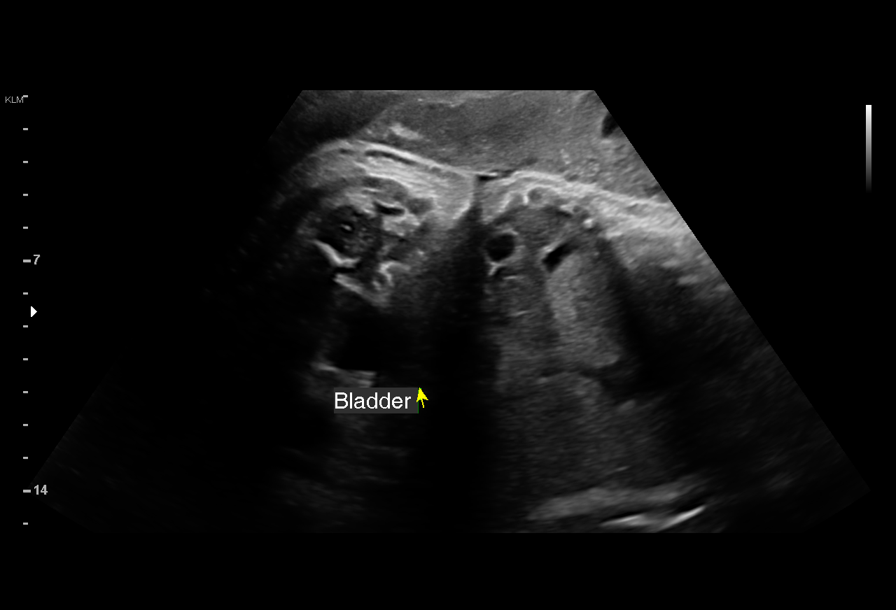
[im 5/16]
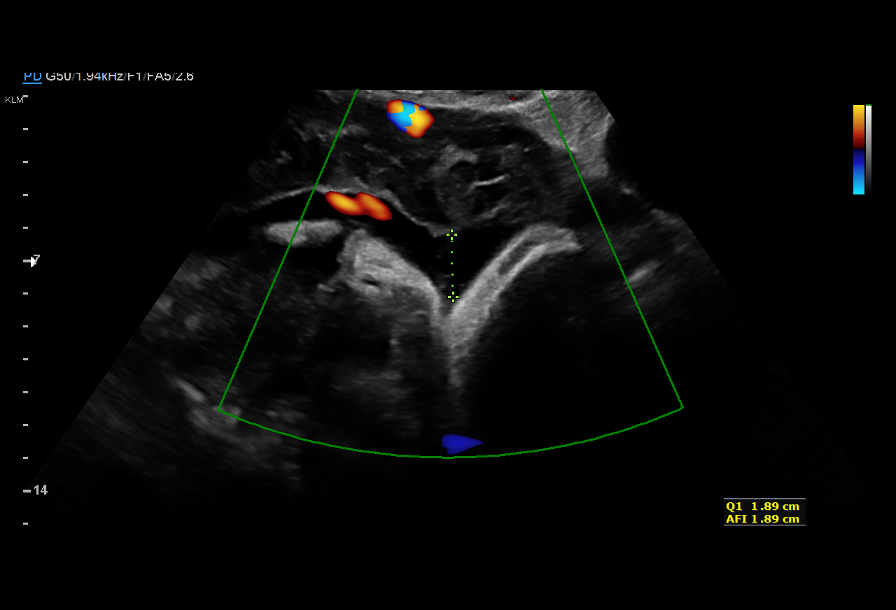
[im 7/16]
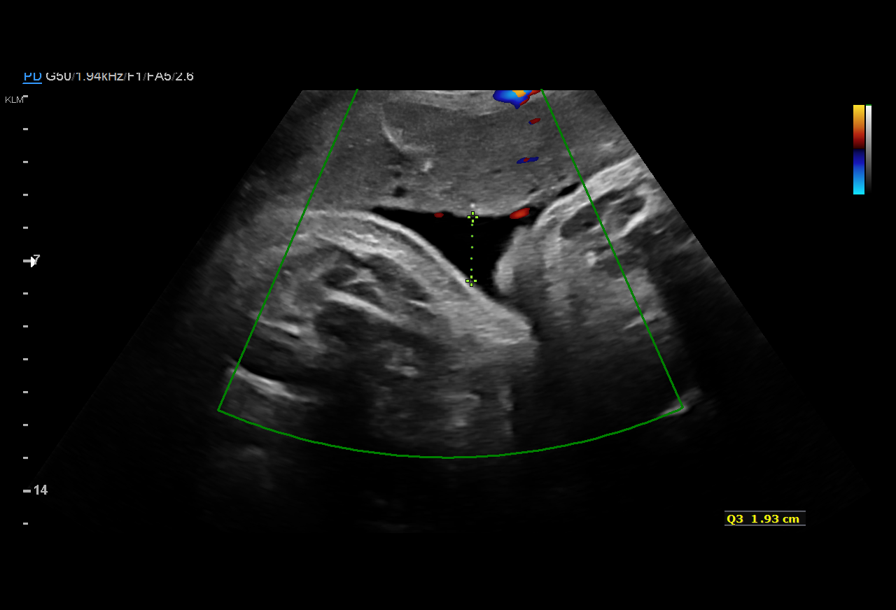
[im 8/16]
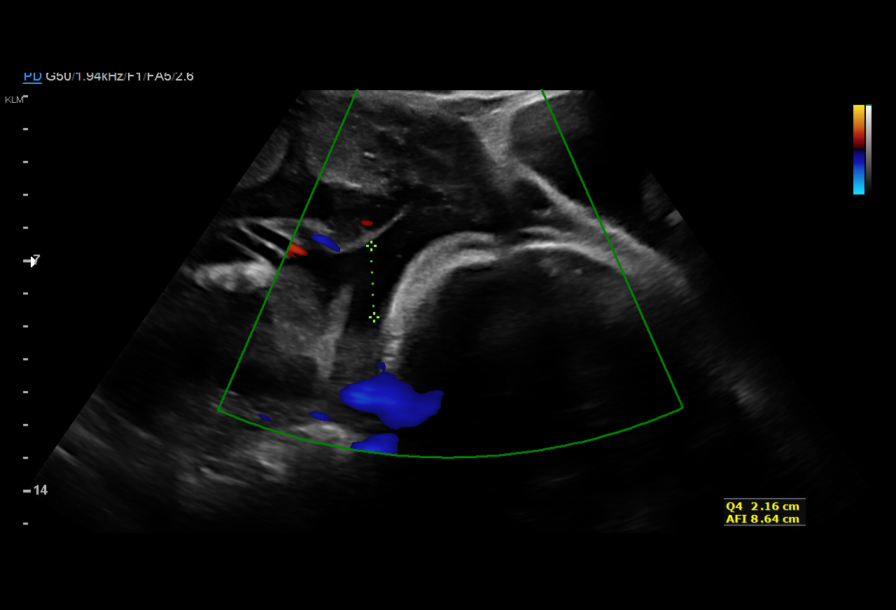
[im 9/16]
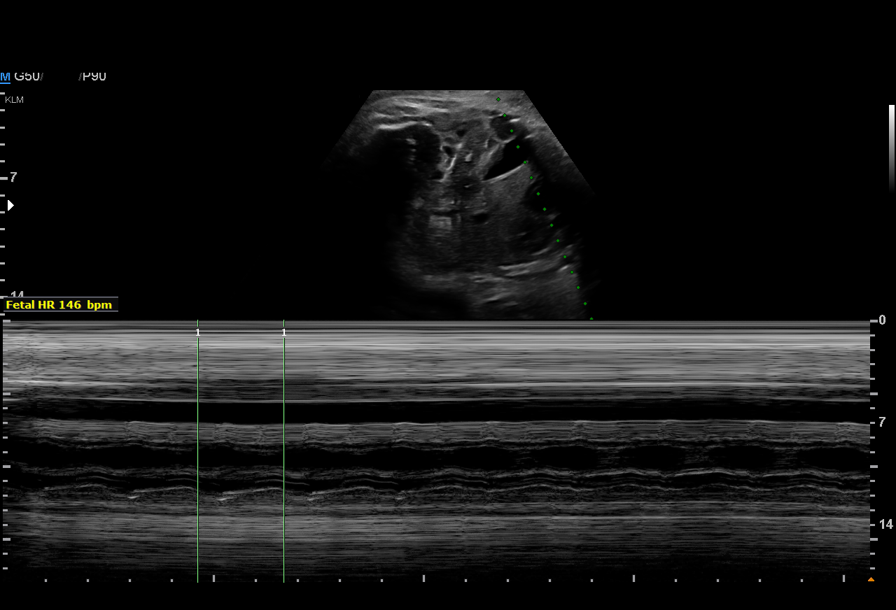
[im 11/16]
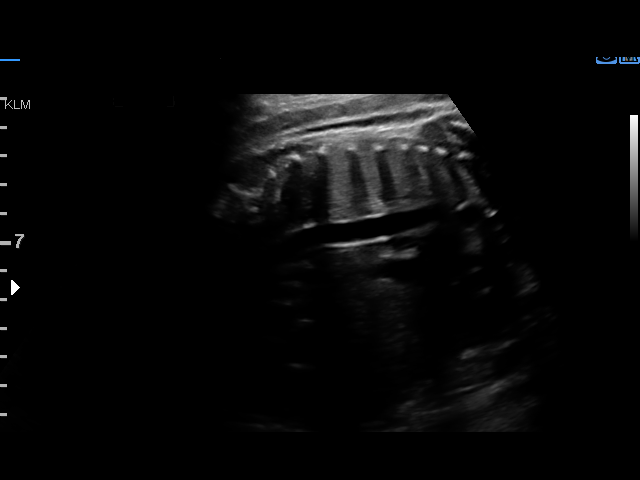
[im 12/16]
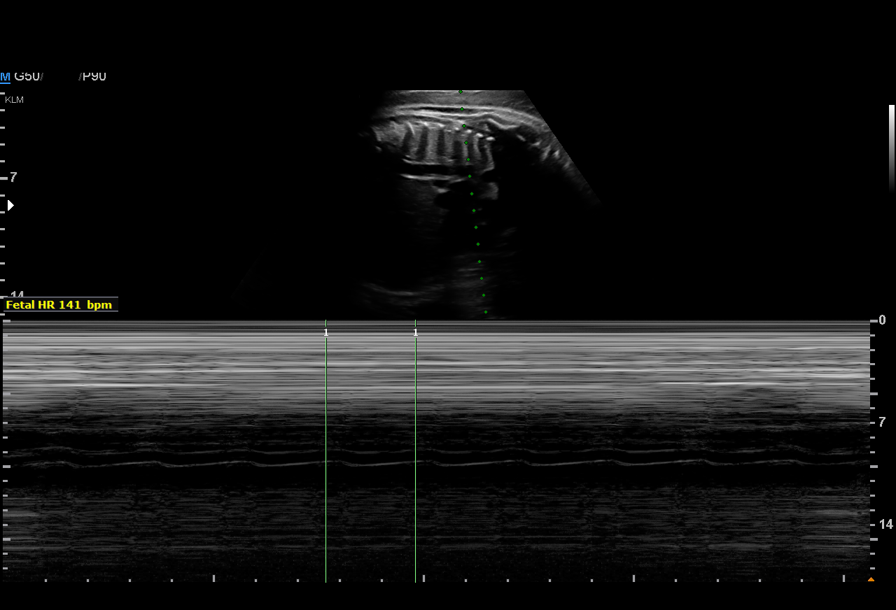
[im 13/16]
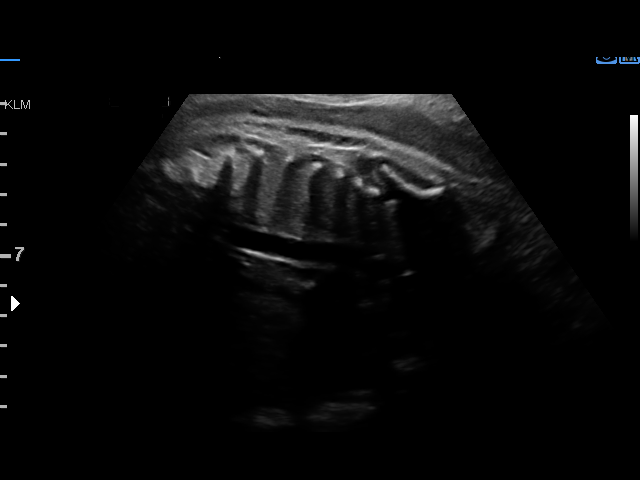
[im 15/16]
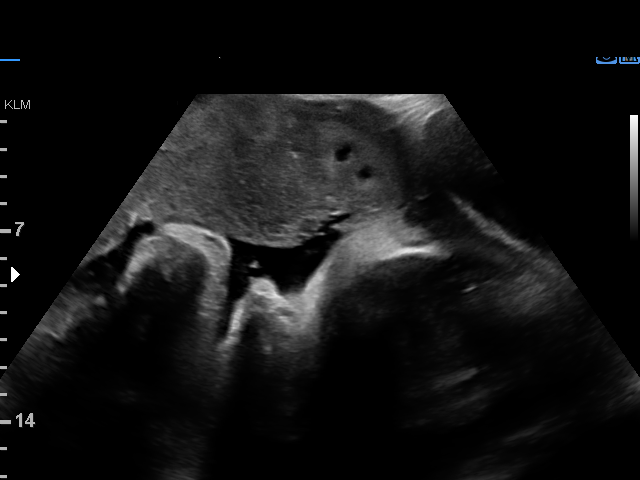
[im 16/16]
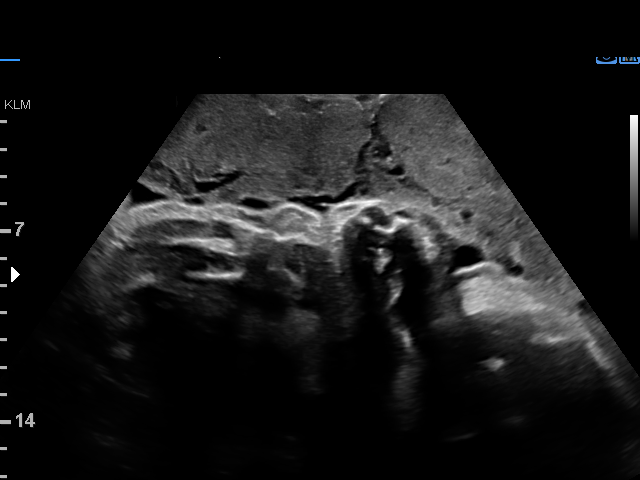

[12 of 16 positions shown; findings below may reference images not displayed]

MAU/Triage
Referred By:      TOKKALEGUR MATTHEA

1  TOKKALEGUR MATTHEA           [id number]      [id number]     [id number]
Indications

39 weeks gestation of pregnancy
Non-reactive NST
OB History

Gravidity:    3         Term:   2        Prem:   0        SAB:   0
TOP:          0       Ectopic:  0        Living: 2
Fetal Evaluation

Num Of Fetuses:     1
Fetal Heart         146
Rate(bpm):
Cardiac Activity:   Observed
Presentation:       Cephalic

Amniotic Fluid
AFI FV:      Subjectively within normal limits

AFI Sum(cm)     %Tile       Largest Pocket(cm)
8.63            18

RUQ(cm)       RLQ(cm)       LUQ(cm)        LLQ(cm)
1.89

Comment:    [DATE] BPP in 28 mins
Biophysical Evaluation

Amniotic F.V:   Within normal limits       F. Tone:        Observed
F. Movement:    Observed                   Score:          [DATE]
F. Breathing:   Observed
Gestational Age
LMP:           39w 5d        Date:  02/09/16                 EDD:   11/15/16
Best:          39w 5d     Det. By:  LMP  (02/09/16)          EDD:   11/15/16
Impression

Single living intrauterine pregnancy at 39 weeks 5 days.
Normal amniotic fluid volume.
BPP [DATE].
Recommendations

Follow-up ultrasounds as clinically indicated.

## 2018-06-21 ENCOUNTER — Encounter: Payer: MEDICAID | Attending: Obstetrics & Gynecology | Primary: Diagnostic Radiology

## 2018-07-05 ENCOUNTER — Encounter: Payer: MEDICAID | Attending: Obstetrics & Gynecology | Primary: Diagnostic Radiology

## 2018-08-09 ENCOUNTER — Ambulatory Visit: Admit: 2018-08-09 | Discharge: 2018-08-09 | Payer: MEDICAID | Primary: Diagnostic Radiology

## 2018-08-09 DIAGNOSIS — Z30430 Encounter for insertion of intrauterine contraceptive device: Secondary | ICD-10-CM

## 2018-08-09 MED ORDER — LEVONORGESTREL 20 MCG/24HR IU IUD
20 MCG/24HR | INTRAUTERINE | Status: DC
Start: 2018-08-09 — End: 2021-06-17

## 2018-08-09 NOTE — Progress Notes (Signed)
[facility]  [facility]  [address]  [place] [address]  Dept: [phone]    Patient Name: Heather Huerta  Patient Age: 37 y.o.  Date of Visit: 08/09/2018    SUBJECTIVE:    CC:    Chief Complaint   Patient presents with   ??? Establish Care       Heather Huerta presents today for insertion of Mirena IUD for her complaint of  Dysmenorrhea.  Heather Huerta understands the risks and benefits of the intrauterine device insertion and desires to proceed with its insertion. 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 Reports Huerta had two IUDs (Mirena). Reports the first was directly after birth and migrated. Reports having had a second IUD without complication. Reports is married with one sex partner.     OBJECTIVE:  Patient's last menstrual period was 08/04/2018 (exact date).    BP 118/64 (Site: Right Upper Arm, Position: Sitting, Cuff Size: Medium Adult)    Pulse 86    Ht 5' 10.8" (1.798 m)    Wt 211 lb 14.4 oz (96.1 kg)    LMP 08/04/2018 (Exact Date)    Breastfeeding? Yes    BMI 29.72 kg/m??     GYN HISTORY:    History of STI: no   New Sex Partner (within 3 months): no   Negative Gonorrhea/ Chlamydia testing: no pt declined testing    No past medical history on file.       OB History   Gravida Para Term Preterm AB Living   3 2 2     3    SAB TAB Ectopic Molar Multiple Live Births             3      # Outcome Date GA Lbr Len/2nd Weight Sex Delivery Anes PTL Lv   3 Gravida 11/17/16    F Vag-Spont   LIV   2 Term 05/04/09    F Vag-Spont   LIV   1 Term 05/02/07    F Vag-Spont   LIV       Urine pregnancy test: patient Huerta not had sex since her LMP    Procedure:    The patient was positioned comfortably on the exam table. After a bi-manual exam; the uterus was found to be  anteverted. There was no cervical motion tenderness.  A sterile speculum was inserted.  The cervix was visualized and prepped with Betadine.  A tenaculum was applied to the anterior lip of the  cervix. A sound was placed through the cervical os in sterile fashion and the uterus was sounded to 7.5 cm.  The Mirena IUD was loaded in the applicator and the indicator placed according to the sound.  The applicator was then inserted into the cervix and the Intrauterine Device was placed in the endometrial cavity.  The applicator was removed and the strings were trimmed to 3 cm.  The patient tolerated the insertion well.    ASSESSMENT:  IUD insertion  Dysmenorrhea     PLAN:  1.  IUD counseling was done with Heather Huerta.  The Manufacturer Mirena booklet was given to the patient.  The consent was signed and scanned into the chart. Heather Huerta was given the IUD identification card.  2.  Heather Huerta was educated that the most common side effects are menstrual changes, lower abdominal pain, back pain, breast tenderness, headache, mood changes, nausea, and acne and are most prevalent during the first 3  months and subside over time.  3.  Post-insertion instructions were also given. Heather Huerta was instructed to not put anything in her vagina for 24 hours, and verbalized understanding. Heather Huerta was instructed to call promptly if she notices any change in the length of the thread or can feel part of the IUD, having pain with sex, having any unusually heavy bleeding from her vagina, believes she is pregnant, believes she may have been exposed to an STI, Huerta unusual pelvic pain, cramping or soreness in her abdomen, Huerta unexplained fever or chills.  4. Heather HasFirdouse was educated that no back up method of contraception is needed if inserted within 7 days after her last menstrual cycle. However, if greater than 7 days Heather Huerta was instructed to use another method of birth control for 7 days if having sex.    Return in about 4 weeks (around 09/06/2018) for 4 week string check.    Electronically Signed by: Arlana LindauShelby Szych, APRN - CNM

## 2018-09-08 ENCOUNTER — Encounter: Primary: Diagnostic Radiology

## 2019-06-07 LAB — COVID-19, ANTIBODY, TOTAL: SARS-CoV-2 Antibody, Total: NEGATIVE

## 2020-04-08 LAB — COVID-19, RAPID: SARS-CoV-2, Rapid: NOT DETECTED

## 2021-03-31 ENCOUNTER — Telehealth: Payer: Self-pay

## 2021-03-31 NOTE — Telephone Encounter (Signed)
Patient would like documentation for the TDAP she received in 2017 during her pregnancy.[phone]

## 2021-04-01 NOTE — Telephone Encounter (Signed)
LMVM TRC. 

## 2021-04-01 NOTE — Telephone Encounter (Signed)
LMVM to notify I will be out of the office the next two days. I have printed her TDAP documentation and left it at the front desk. She will need to come by to sign a release form and pick it up. We are unable to email it to her or send thru my chart as the document is in our old EMR system. I have documented the administration in Epic and she can view/print if needed if she will sign up for my chart. I sent a my chart invite via text.

## 2021-04-11 LAB — HEPATITIS B SURFACE ANTIBODY: Hep B S Ab: 481.9 m[IU]/mL — ABNORMAL HIGH (ref <10)

## 2021-04-14 LAB — T-SPOT TB TEST

## 2021-06-16 ENCOUNTER — Inpatient Hospital Stay: Payer: BLUE CROSS/BLUE SHIELD | Primary: Diagnostic Radiology

## 2021-06-16 ENCOUNTER — Encounter

## 2021-06-16 DIAGNOSIS — O039 Complete or unspecified spontaneous abortion without complication: Secondary | ICD-10-CM

## 2021-06-16 NOTE — Telephone Encounter (Signed)
Pt called the office stating wanting an appointment today because she is having some spotting in pregnancy.  Her pregnancy has not been confirmed yet, she has had positive at home pregnancy tests.  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  I informed her that our schedule is full today but I would talk to Dr. Arbutus Ped to see what she would like to do.  Pt was informed that the Dr. Lynnda Child order [facility] and [place] to be done ASAP

## 2021-06-17 ENCOUNTER — Ambulatory Visit
Admit: 2021-06-17 | Discharge: 2021-06-17 | Payer: BLUE CROSS/BLUE SHIELD | Attending: Obstetrics & Gynecology | Primary: Diagnostic Radiology

## 2021-06-17 ENCOUNTER — Ambulatory Visit: Primary: Diagnostic Radiology

## 2021-06-17 ENCOUNTER — Ambulatory Visit: Payer: BLUE CROSS/BLUE SHIELD | Primary: Diagnostic Radiology

## 2021-06-17 ENCOUNTER — Ambulatory Visit
Admit: 2021-06-17 | Discharge: 2021-06-17 | Payer: BLUE CROSS/BLUE SHIELD | Attending: Family | Primary: Diagnostic Radiology

## 2021-06-17 ENCOUNTER — Inpatient Hospital Stay: Payer: BLUE CROSS/BLUE SHIELD | Primary: Diagnostic Radiology

## 2021-06-17 DIAGNOSIS — O021 Missed abortion: Secondary | ICD-10-CM

## 2021-06-17 DIAGNOSIS — N926 Irregular menstruation, unspecified: Secondary | ICD-10-CM

## 2021-06-17 DIAGNOSIS — O039 Complete or unspecified spontaneous abortion without complication: Secondary | ICD-10-CM

## 2021-06-17 LAB — CBC WITH AUTO DIFFERENTIAL
Basophils %: 2 % (ref 0–2)
Basophils Absolute: 0.1 10*3/uL (ref 0.0–0.2)
Eosinophils %: 2 % (ref 0–4)
Eosinophils Absolute: 0.1 10*3/uL (ref 0.0–0.4)
Hematocrit: 35.7 % — ABNORMAL LOW (ref 36–46)
Hemoglobin: 11.8 g/dL — ABNORMAL LOW (ref 12.0–16.0)
Lymphocytes Absolute: 2.1 10*3/uL (ref 1.0–4.8)
Lymphocytes: 41 % (ref 24–44)
MCH: 30.8 pg (ref 26–34)
MCHC: 33 g/dL (ref 31–37)
MCV: 93.4 fL (ref 80–100)
MPV: 9.2 fL (ref 6.0–12.0)
Monocytes %: 8 % — ABNORMAL HIGH (ref 1–7)
Monocytes Absolute: 0.4 10*3/uL (ref 0.1–1.3)
Neutrophils Absolute: 2.5 10*3/uL (ref 1.3–9.1)
Platelets: 209 10*3/uL (ref 150–450)
RBC: 3.83 m/uL — ABNORMAL LOW (ref 4.0–5.2)
RDW: 13.8 % (ref 11.5–14.9)
Seg Neutrophils: 47 % (ref 36–66)
WBC: 5.2 10*3/uL (ref 3.5–11.0)

## 2021-06-17 LAB — PRENATAL TYPE AND SCREEN
ABO/Rh: B POS
Antibody Screen: NEGATIVE

## 2021-06-17 LAB — HCG, QUANTITATIVE, PREGNANCY
hCG Quant: 23052 m[IU]/mL — ABNORMAL HIGH (ref <5)
hCG Quant: 26805 m[IU]/mL — ABNORMAL HIGH (ref <5)

## 2021-06-17 LAB — US OB TRANSVAGINAL

## 2021-06-17 MED ORDER — MISOPROSTOL 200 MCG PO TABS
200 | ORAL_TABLET | ORAL | 0 refills | Status: DC
Start: 2021-06-17 — End: 2021-06-27

## 2021-06-17 NOTE — Progress Notes (Signed)
Heather Huerta  06/17/2021    Date Of Birth:  Jul 14, 1981          The patient was seen today. Patient is here to establish care. She is here regarding questionable missed AB.  LMP 03/29/2021.  + home pregnancy test September 17th.  Patient had heavy bleeding 5 weeks ago for 2 days.  Had positive quant HCG level at this time.  No records available.  Patient is a physician.  Had unofficial ultrasound last week - showed nonviable pregnancy.  Would like medication to start process for missed AB.  Agrees to pelvic ultrasound in office today for verification.  Patient denies any current vaginal bleeding, pelvic cramping or fever. Hx of 3 full term vaginal deliveries. Her bowels are regular and she is voiding without difficulty.     HPI:  Heather Huerta is a 40 y.o. female G3P2003       Missed AB    OB History   Gravida Para Term Preterm AB Living   3 2 2  0 0 3   SAB IAB Ectopic Molar Multiple Live Births   0 0 0 0 0 3      # Outcome Date GA Lbr Len/2nd Weight Sex Delivery Anes PTL Lv   3 Gravida 11/17/16    F Vag-Spont   LIV   2 Term 05/04/09    F Vag-Spont   LIV   1 Term 05/02/07    F Vag-Spont   LIV       History reviewed. No pertinent past medical history.    Past Surgical History:   Procedure Laterality Date    INTRAUTERINE DEVICE INSERTION N/A 08/09/2018    MIrena insertion NDC 08/11/2018.Tu02bthExp.Mar.2022       History reviewed. No pertinent family history.    Social History     Socioeconomic History    Marital status: Unknown     Spouse name: Not on file    Number of children: Not on file    Years of education: Not on file    Highest education level: Not on file   Occupational History    Not on file   Tobacco Use    Smoking status: Never    Smokeless tobacco: Never   Vaping Use    Vaping Use: Never used   Substance and Sexual Activity    Alcohol use: Not Currently    Drug use: Never    Sexual activity: Yes     Partners: Male   Other Topics Concern    Not on file   Social History Narrative    Not on file      Social Determinants of Health     Financial Resource Strain: Not on file   Food Insecurity: Not on file   Transportation Needs: Not on file   Physical Activity: Not on file   Stress: Not on file   Social Connections: Not on file   Intimate Partner Violence: Not on file   Housing Stability: Not on file         MEDICATIONS:  No current outpatient medications on file.     Current Facility-Administered Medications   Medication Dose Route Frequency Provider Last Rate Last Admin    levonorgestrel (MIRENA) IUD 52 mg 1 each  1 each IntraUTERine Continuous [id number], APRN - CNM                 ALLERGIES:  Allergies as of 06/17/2021    (No Known Allergies)  REVIEW OF SYSTEMS:    yes   A minimum of an eleven point review of systems was completed.    Review Of Systems (11 point):  Constitutional: No fever, chills or malaise; No weight change or fatigue  Head and Eyes: No vision, Headache, Dizziness or trauma in last 12 months  ENT ROS: No hearing, Tinnitis, sinus or taste problems  Hematological and Lymphatic ROS:No Lymphoma, Von Willebrand's, Hemophillia or Bleeding History  Psych ROS: No Depression, Homicidal thoughts,suicidal thoughts, or anxiety  Breast ROS: No prior breast abnormalities or lumps  Respiratory ROS: No SOB, Pneumoniae,Cough, or Pulmonary Embolism History  Cardiovascular ROS: No Chest Pain with Exertion, Palpitations, Syncope, Edema, Arrhythmia  Gastrointestinal ROS: No Indigestion, Heartburn, Nausea, vomiting, Diarrhea, Constipation,or Bowel Changes; No Bloody Stools or melena  Genito-Urinary ROS: No Dysuria, Hematuria or Nocturia. No Urinary Incontinence or Vaginal Discharge. + missed AB  Musculoskeletal ROS: No Arthralgia, Arthritis,Gout,Osteoporosis or Rheumatism  Neurological ROS: No CVA, Migraines, Epilepsy, Seizure Hx, or Limb Weakness  Dermatological ROS: No Rash, Itching, Hives, Mole Changes or Cancer          Blood pressure 118/80, height 5\' 10"  (1.778 m), weight 211 lb (95.7 kg), last  menstrual period 03/29/2021, currently breastfeeding.         Abdomen: Soft non-tender; good bowel sounds. No guarding, rebound or rigidity. No CVA tenderness bilaterally.    Extremities: No calf tenderness, DTR 2/4, and No edema bilaterally    Pelvic: Exam deferred.    Diagnostics:  No results found.    Lab Results:  Results for orders placed or performed during the hospital encounter of 06/16/21   hCG, Quantitative, Pregnancy   Result Value Ref Range    hCG Quant 26,805 (H) <5 mIU/mL         Assessment:   Diagnosis Orders   1. Missed ab  CBC with Auto Differential    Prenatal type and screen    HCG, Quantitative, Pregnancy    06/18/21 OB TRANSVAGINAL      2. Missed menses  CBC with Auto Differential    Prenatal type and screen    HCG, Quantitative, Pregnancy        There are no problems to display for this patient.          PLAN:  Return in about 16 days (around 07/03/2021) for follow up missed AB.  Pelvic ultrasound in office- verifies no FHT's.  Desires management with medication.  DR 13/05/2021 aware.  Lab slips given for CBC, type and screen and repeat quant HCG.  Script for cytotec.  Call office if increase in bleeding, pelvic pain or fever.  F/U in 1 week with physician.  Repeat Annual every 1 year  Cervical Cytology Evaluation begins at 41 years old.  If Negative Cytology, Follow-up screening per current guidelines.   Return to the office in 1 weeks.  Counseled on preventative health maintenance follow-up.  Orders Placed This Encounter   Procedures    36 OB TRANSVAGINAL     This procedure can be scheduled via MyChart.  Access your MyChart account by visiting [place].     Standing Status:   Future     Standing Expiration Date:   06/17/2022     Order Specific Question:   Reason for exam:     Answer:   missed ab    CBC with Auto Differential     Standing Status:   Future     Standing Expiration Date:   06/17/2022  HCG, Quantitative, Pregnancy     Standing Status:   Future     Standing Expiration Date:    07/17/2021    Prenatal type and screen     Standing Status:   Future     Standing Expiration Date:   06/17/2022           The patient, Heather Huerta is a 40 y.o. female, was seen with a total time spent of 30 minutes for the visit on this date of service by the E/M provider. The time component had both face to face and non face to face time spent in determining the total time component.  Counseling and education regarding her diagnosis listed below and her options regarding those diagnoses were also included in determining her time component.      Diagnosis Orders   1. Missed ab  CBC with Auto Differential    Prenatal type and screen    HCG, Quantitative, Pregnancy    US OB TRANSVAGINAL      2. Missed menses  CBC with Auto Differential    Prenatal type and screen    HCG, Quantitative, Pregnancy           The patient had her preventative health maintenance recommendations and follow-up reviewed with her at the completion of her visit.

## 2021-06-17 NOTE — Progress Notes (Signed)
See scanned report

## 2021-06-18 ENCOUNTER — Encounter

## 2021-06-18 NOTE — Telephone Encounter (Signed)
Per Raynelle Fanning NP, attempted to contact pt UI:[id number].  No answer.  Will try again later.

## 2021-06-18 NOTE — Telephone Encounter (Signed)
-----   Message from Nicholes Rough, APRN - CNP sent at 06/18/2021  7:57 AM EDT -----  Decreasing quant HCG levels.  Missed AB  Seen in office yesterday.  To repeat quant HCG every 2 weeks until negative.  Call office if increased bleeding, pelvic pain or fever.  Abstain from intercourse until quant HCG is negative

## 2021-06-19 MED ORDER — IBUPROFEN 800 MG PO TABS
800 MG | ORAL_TABLET | Freq: Three times a day (TID) | ORAL | 0 refills | Status: DC | PRN
Start: 2021-06-19 — End: 2021-06-19

## 2021-06-19 MED ORDER — IBUPROFEN 800 MG PO TABS
800 MG | ORAL_TABLET | Freq: Three times a day (TID) | ORAL | 0 refills | Status: AC | PRN
Start: 2021-06-19 — End: 2023-03-18

## 2021-06-19 NOTE — Telephone Encounter (Signed)
Pt notified via myChart of results and recommendations.          Zenaida Deed Beam, APRN - CNP  P [facility] Clinical Support Pool  Decreasing quant HCG levels.   Missed AB   Seen in office yesterday.   To repeat quant HCG every 2 weeks until negative.   Call office if increased bleeding, pelvic pain or fever.   Abstain from intercourse until quant HCG is negative

## 2021-06-19 NOTE — Telephone Encounter (Signed)
From: Almeta Monas  To: Zenaida Deed Beam  Sent: 06/18/2021 10:27 PM EDT  Subject: Pain meds     Hello,  I wonder if I can have a prescription pain medicine that I can take when I start taking cytotec?  Thank you .

## 2021-06-20 ENCOUNTER — Encounter: Attending: Student in an Organized Health Care Education/Training Program | Primary: Diagnostic Radiology

## 2021-06-27 ENCOUNTER — Ambulatory Visit
Admit: 2021-06-27 | Discharge: 2021-06-27 | Payer: BLUE CROSS/BLUE SHIELD | Attending: Obstetrics & Gynecology | Primary: Diagnostic Radiology

## 2021-06-27 ENCOUNTER — Inpatient Hospital Stay: Payer: BLUE CROSS/BLUE SHIELD | Primary: Diagnostic Radiology

## 2021-06-27 DIAGNOSIS — O021 Missed abortion: Secondary | ICD-10-CM

## 2021-06-27 DIAGNOSIS — O039 Complete or unspecified spontaneous abortion without complication: Secondary | ICD-10-CM

## 2021-06-27 LAB — HCG, QUANTITATIVE, PREGNANCY: hCG Quant: 13901 m[IU]/mL — ABNORMAL HIGH (ref <5)

## 2021-06-27 MED ORDER — ONDANSETRON 4 MG PO TBDP
4 MG | ORAL_TABLET | Freq: Three times a day (TID) | ORAL | 0 refills | Status: AC | PRN
Start: 2021-06-27 — End: 2023-03-18
  Filled 2021-06-27: qty 30, 10d supply, fill #0

## 2021-06-27 NOTE — Progress Notes (Signed)
Heather Huerta  06/27/2021    Date Of Birth:  Jul 20, 1981          The patient was seen today. She is here regarding follow up on missed AB. Pt took cytotec as prescribed. Pt thinks she passed tissue yesterday. Pt states she does not have heavy bleeding or cramping. Pt wishes to repeat BHCG and if levels falling just follow BHCGs until negative but if plateau ing repeat sono. Pt declined surgical procedure. If needed pt wishes to repeat cytotec. Her bowels are regular and she is voiding without difficulty.     HPI:  Heather Huerta is a 40 y.o. female G3P2003       OB History   Gravida Para Term Preterm AB Living   3 2 2  0 0 3   SAB IAB Ectopic Molar Multiple Live Births   0 0 0 0 0 3      # Outcome Date GA Lbr Len/2nd Weight Sex Delivery Anes PTL Lv   3 Gravida 11/17/16    F Vag-Spont   LIV   2 Term 05/04/09    F Vag-Spont   LIV   1 Term 05/02/07    F Vag-Spont   LIV       History reviewed. No pertinent past medical history.    Past Surgical History:   Procedure Laterality Date    INTRAUTERINE DEVICE INSERTION N/A 08/09/2018    MIrena insertion NDC 08/11/2018.Tu02bthExp.Mar.2022       History reviewed. No pertinent family history.    Social History     Socioeconomic History    Marital status: Unknown     Spouse name: Not on file    Number of children: Not on file    Years of education: Not on file    Highest education level: Not on file   Occupational History    Not on file   Tobacco Use    Smoking status: Never    Smokeless tobacco: Never   Vaping Use    Vaping Use: Never used   Substance and Sexual Activity    Alcohol use: Not Currently    Drug use: Never    Sexual activity: Yes     Partners: Male   Other Topics Concern    Not on file   Social History Narrative    Not on file     Social Determinants of Health     Financial Resource Strain: Not on file   Food Insecurity: Not on file   Transportation Needs: Not on file   Physical Activity: Not on file   Stress: Not on file   Social Connections: Not on file    Intimate Partner Violence: Not on file   Housing Stability: Not on file         MEDICATIONS:  Current Outpatient Medications   Medication Sig Dispense Refill    ondansetron (ZOFRAN-ODT) 4 MG disintegrating tablet Take 1 tablet by mouth every 8 hours as needed for Nausea or Vomiting 30 tablet 0    ibuprofen (ADVIL;MOTRIN) 800 MG tablet Take 1 tablet by mouth every 8 hours as needed for Pain 60 tablet 0     No current facility-administered medications for this visit.             ALLERGIES:  Allergies as of 06/27/2021    (No Known Allergies)         REVIEW OF SYSTEMS:       A minimum of an eleven point review of systems was  completed.    Review Of Systems (11 point):  Constitutional: No fever, chills or malaise; No weight change or fatigue  Head and Eyes: No vision, Headache, Dizziness or trauma in last 12 months  ENT ROS: No hearing, Tinnitis, sinus or taste problems  Hematological and Lymphatic ROS:No Lymphoma, Von Willebrand's, Hemophillia or Bleeding History  Psych ROS: No Depression, Homicidal thoughts,suicidal thoughts, or anxiety  Breast ROS: No prior breast abnormalities or lumps  Respiratory ROS: No SOB, Pneumoniae,Cough, or Pulmonary Embolism History  Cardiovascular ROS: No Chest Pain with Exertion, Palpitations, Syncope, Edema, Arrhythmia  Gastrointestinal ROS: No Indigestion, Heartburn, Nausea, vomiting, Diarrhea, Constipation,or Bowel Changes; No Bloody Stools or melena  Genito-Urinary ROS: No Dysuria, Hematuria or Nocturia. No Urinary Incontinence or Vaginal Discharge  Musculoskeletal ROS: No Arthralgia, Arthritis,Gout,Osteoporosis or Rheumatism  Neurological ROS: No CVA, Migraines, Epilepsy, Seizure Hx, or Limb Weakness  Dermatological ROS: No Rash, Itching, Hives, Mole Changes or Cancer          Blood pressure 108/60, height 5\' 9"  (1.753 m), weight 215 lb (97.5 kg), not currently breastfeeding.             Abdomen: Soft non-tender; good bowel sounds. No guarding, rebound or rigidity. No CVA tenderness  bilaterally.    Extremities: No calf tenderness, DTR 2/4, and No edema bilaterally    Pelvic: Exam deferred.    Diagnostics:  OB TRANSVAGINAL    Result Date: 06/17/2021  [facility] [address], [place] [phone] mn [phone] Fax FIRST TRIMESTER ULTRASOUND REPORT EXAMINATION: PELVIC ULTRASOUND-First Trimester (<14 weeks) 06/17/2021 MRN: 06/19/2021 Contact Serial #: [id number] Date of Birth: Jan 12, 1981 Age: 40 y.o. Performed By: 46 RDMS Attending: Antionette Fairy, DO Referred By: No referring provider defined for this encounter. Services Provided: First Trimester Ultrasound (<14 weeks) Specific Ultrasound Imaging Technique Utilized: Transabdominal Approach: Yes Transvaginal Approach: No Comparison: none HISTORY & INDICATION: History: Heather Huerta is a 40 y.o. female G47P2003 EGA: Unknown OB History  G3  P2  T2  P0  A0  L3  SAB0  IAB0  Ectopic0  Molar0  Multiple0  Live Births3 Name of Baby 1: Not recorded   Date: 05/02/07         GA: Not recorded     Delivery: Vaginal, Spontaneous   Apgar1: Not recorded     Apgar5: Not recorded   Living: Living Name of Baby 2: Not recorded   Date: 05/04/09         GA: Not recorded     Delivery: Vaginal, Spontaneous   Apgar1: Not recorded     Apgar5: Not recorded   Living: Living Name of Baby 3: Not recorded   Date: 11/17/16         GA: Not recorded     Delivery: Vaginal, Spontaneous   Apgar1: Not recorded     Apgar5: Not recorded   Living: Living Indication/Reason for imaging exam: 1st trimester dating/viability Miscarriage Summary: The images were reviewed. The Ultrasound report is scanned and attached for specific findings and measurements. No LMP recorded.       Gest Age     EDC LMP:           ??????     ???? Ultrasound Today:  [redacted]w[redacted]d +/-3d   02/11/2022  (BEST)  IMPRESSION: 1. There is  a Single NON-viable intrauterine pregnancy. 2. Pregnancy dating (EDD) was completed today and confirmed  by ultrasound. 3. CRL is 0.25 cm  and FHR is not confirmed and NO cardiac activity is identified 4. The patient was counseled on the incidents of birth defects in the general population is 2-4% and that ultrasound does not diagnose every form of congenital abnormality and has limitations. The patient was made fully aware and understands that the only way to evaluate the chromosomal makeup to near 100% certainty is with invasive testing such as chorionic villous sampling or amniocentesis. These options were reviewed in detail with all risks/benefits and alternatives. NIPT testing was also reviewed with the patient, taking a maternal blood sample and screening it for fetal cells then performing a genetic karyotype.  She was made aware that if this were to be positive for a trisomy then a confirmatory amniocentesis or [facility] for confirmation would be needed. The patient Requested an MFM referral to further discuss testing.  TOPSTOH guidelines were reviewed with the patient. 5. There were not any adnexal masses 6. The gestational sac is irregular and early and there are falling BHCG levels. From ~26k to 23k 7. Probable fetal demise RECOMMENDATIONS: Obtain a CBC and Type and RH if not already resulted Discuss with patient Signs and symptoms of SAB Abstain Obtain Quant BHCG in 72 hours.  Pending results, Rising.  Possible follow up imaging in 1 week Continue with follow up GYN visit after labs The above findings and recommendations were reviewed with the patient today. She is amenable to the plan of care. Electronically signed by Melody Haver, DO on 06/17/21 at 9:07 PM EDT       Lab Results:  Results for orders placed or performed during the hospital encounter of 06/17/21   HCG, Quantitative, Pregnancy   Result Value Ref Range    hCG Quant 23,052 (H) <5 mIU/mL   CBC with Auto Differential   Result Value Ref Range    WBC 5.2 3.5 - 11.0 k/uL    RBC 3.83 (L) 4.0 - 5.2 m/uL    Hemoglobin 11.8 (L) 12.0 - 16.0 g/dL     Hematocrit 45.0 (L) 36 - 46 %    MCV 93.4 80 - 100 fL    MCH 30.8 26 - 34 pg    MCHC 33.0 31 - 37 g/dL    RDW 38.8 82.8 - 00.3 %    Platelets 209 150 - 450 k/uL    MPV 9.2 6.0 - 12.0 fL    Seg Neutrophils 47 36 - 66 %    Lymphocytes 41 24 - 44 %    Monocytes 8 (H) 1 - 7 %    Eosinophils % 2 0 - 4 %    Basophils 2 0 - 2 %    Segs Absolute 2.50 1.3 - 9.1 k/uL    Absolute Lymph # 2.10 1.0 - 4.8 k/uL    Absolute Mono # 0.40 0.1 - 1.3 k/uL    Absolute Eos # 0.10 0.0 - 0.4 k/uL    Basophils Absolute 0.10 0.0 - 0.2 k/uL   PRENATAL TYPE AND SCREEN   Result Value Ref Range    ABO/Rh B POSITIVE     Antibody Screen NEGATIVE     Blood Bank Comment ABORH CONFIRMED B POS 043  Results Verified            Assessment:   Diagnosis Orders   1. Missed ab          Patient Active Problem List    Diagnosis Date Noted    Missed ab 06/27/2021     Priority:  Medium           PLAN:  Return in about 4 weeks (around 07/25/2021) for IUD  INSERTION.  Follow BHCGs until negative then RTO for cultures / IUD  Repeat Annual every 1 year  Cervical Cytology Evaluation begins at 41 years old.  If Negative Cytology, Follow-up screening per current guidelines.   Return to the office in 3-4 weeks.  Counseled on preventative health maintenance follow-up.  No orders of the defined types were placed in this encounter.          The patient, Heather Huerta is a 40 y.o. female, was seen with a total time spent of 20 minutes for the visit on this date of service by the E/M provider. The time component had both face to face and non face to face time spent in determining the total time component.  Counseling and education regarding her diagnosis listed below and her options regarding those diagnoses were also included in determining her time component.      Diagnosis Orders   1. Missed ab             The patient had her preventative health maintenance recommendations and follow-up reviewed with her at the completion of her visit.

## 2021-06-30 ENCOUNTER — Telehealth

## 2021-06-30 NOTE — Telephone Encounter (Signed)
-----   Message from Dante Gang, APRN - NP sent at 06/27/2021 12:16 PM EDT -----  Sharene Butters decreasing: SAB   Instruct to repeat quant every 2 weeks until negative  Instruct to abstain from intercourse until negative Quant  Call office with increased pelvic pain, increased bleeding, fever   Seen in office today

## 2021-06-30 NOTE — Telephone Encounter (Signed)
Per Joni Reining NP, left VM for pt Heather Huerta.  Also, pt saw results in myChart; so sent a message regarding results.

## 2021-07-31 ENCOUNTER — Inpatient Hospital Stay: Payer: BLUE CROSS/BLUE SHIELD | Primary: Diagnostic Radiology

## 2021-07-31 DIAGNOSIS — O021 Missed abortion: Secondary | ICD-10-CM

## 2021-07-31 LAB — HCG, QUANTITATIVE, PREGNANCY: hCG Quant: 1371 m[IU]/mL — ABNORMAL HIGH (ref <5)

## 2021-08-01 ENCOUNTER — Telehealth

## 2021-08-01 NOTE — Telephone Encounter (Signed)
-----   Message from Nicholes Rough, APRN - CNP sent at 08/01/2021  8:03 AM EST -----  Hx of SAB  Decreasing quant  To repeat quant HCG in 2 weeks- every 2 weeks until negative.  Abstain from intercourse until quant HCG is negative

## 2021-08-01 NOTE — Telephone Encounter (Signed)
Per Raynelle Fanning pt notified of decreasing quant results and pt   To repeat quant HCG in 2 weeks- every 2 weeks until negative.order in epic.  Pt to Abstain from intercourse until quant HCG is negative. Per Raynelle Fanning pt to follow up in office to discuss birthcontrol after negative quant.

## 2021-08-28 ENCOUNTER — Inpatient Hospital Stay: Payer: BLUE CROSS/BLUE SHIELD | Primary: Diagnostic Radiology

## 2021-08-28 DIAGNOSIS — O039 Complete or unspecified spontaneous abortion without complication: Secondary | ICD-10-CM

## 2021-08-29 ENCOUNTER — Telehealth

## 2021-08-29 LAB — HCG, QUANTITATIVE, PREGNANCY: hCG Quant: 312 m[IU]/mL — ABNORMAL HIGH (ref <5)

## 2021-08-29 NOTE — Telephone Encounter (Signed)
Per Joni Reining pt notified of Decreased Quant HCG and pt   To repeat quant in 2 weeks- every 2 weeks until negative. Standing order in epic.  Pt instructed to Abstain from intercourse until quant is negative

## 2021-08-29 NOTE — Telephone Encounter (Signed)
-----   Message from Nicholes Rough, APRN - CNP sent at 08/29/2021  7:49 AM EST -----  Decreased Quant HCG  To repeat quant in 2 weeks- every 2 weeks until negative.  Abstain from intercourse until quant is negative

## 2021-09-16 ENCOUNTER — Inpatient Hospital Stay: Payer: BLUE CROSS/BLUE SHIELD | Primary: Diagnostic Radiology

## 2021-09-16 DIAGNOSIS — O039 Complete or unspecified spontaneous abortion without complication: Secondary | ICD-10-CM

## 2021-09-17 LAB — HCG, QUANTITATIVE, PREGNANCY: hCG Quant: 237 m[IU]/mL — ABNORMAL HIGH (ref <5)

## 2021-09-17 NOTE — Telephone Encounter (Signed)
Patient notified of results. Has standing order for HCG draws. Will repeat in 2 weeks.

## 2021-09-17 NOTE — Telephone Encounter (Signed)
-----   Message from Nicholes Rough, APRN - CNP sent at 09/17/2021  7:59 AM EST -----  Decreasing quant HCG level  Repeat quant HCG in 2 weeks.  To repeat every 2 weeks until negative.  Abstain from intercourse until quant HCG is negative.

## 2021-10-27 ENCOUNTER — Encounter

## 2021-10-28 NOTE — Telephone Encounter (Signed)
From: Almeta Monas  To: Zenaida Deed Beam  Sent: 10/27/2021 5:44 PM EST  Subject: Blood work     Hello   Can you order my bhcg blood work please.     I went to get blood drawn and it is not there.  Thank you

## 2022-06-26 ENCOUNTER — Encounter
Payer: BLUE CROSS/BLUE SHIELD | Attending: Student in an Organized Health Care Education/Training Program | Primary: Diagnostic Radiology

## 2022-07-10 ENCOUNTER — Encounter
Payer: BLUE CROSS/BLUE SHIELD | Attending: Student in an Organized Health Care Education/Training Program | Primary: Diagnostic Radiology

## 2022-07-27 ENCOUNTER — Encounter
Payer: BLUE CROSS/BLUE SHIELD | Attending: Student in an Organized Health Care Education/Training Program | Primary: Diagnostic Radiology

## 2022-08-09 ENCOUNTER — Inpatient Hospital Stay
Admit: 2022-08-09 | Discharge: 2022-08-09 | Disposition: A | Discharge status: Home / Self Care | Payer: PRIVATE HEALTH INSURANCE | Attending: Emergency Medicine

## 2022-08-09 DIAGNOSIS — J029 Acute pharyngitis, unspecified: Secondary | ICD-10-CM

## 2022-08-09 LAB — CBC WITH AUTO DIFFERENTIAL
Basophils %: 1 % (ref 0–2)
Basophils Absolute: 0.06 10*3/uL (ref 0.00–0.20)
Eosinophils %: 2 % (ref 1–4)
Eosinophils Absolute: 0.14 10*3/uL (ref 0.00–0.44)
Hematocrit: 35.2 % — ABNORMAL LOW (ref 36.3–47.1)
Hemoglobin: 11.6 g/dL — ABNORMAL LOW (ref 11.9–15.1)
Immature Granulocytes %: 0 %
Immature Granulocytes Absolute: 0.01 10*3/uL (ref 0.00–0.30)
Lymphocytes %: 36 % (ref 24–43)
Lymphocytes Absolute: 2.37 10*3/uL (ref 1.10–3.70)
MCH: 30.8 pg (ref 25.2–33.5)
MCHC: 33 g/dL (ref 28.4–34.8)
MCV: 93.4 fL (ref 82.6–102.9)
MPV: 11.1 fL (ref 8.1–13.5)
Monocytes %: 8 % (ref 3–12)
Monocytes Absolute: 0.49 10*3/uL (ref 0.10–1.20)
NRBC Automated: 0 per 100 WBC
Neutrophils %: 53 % (ref 36–65)
Neutrophils Absolute: 3.48 10*3/uL (ref 1.50–8.10)
Platelets: 224 10*3/uL (ref 138–453)
RBC: 3.77 m/uL — ABNORMAL LOW (ref 3.95–5.11)
RDW: 13 % (ref 11.8–14.4)
WBC: 6.6 10*3/uL (ref 3.5–11.3)

## 2022-08-09 LAB — C-REACTIVE PROTEIN: CRP: 20.4 mg/L — ABNORMAL HIGH (ref 0.0–5.0)

## 2022-08-09 MED ORDER — AMOXICILLIN-POT CLAVULANATE 875-125 MG PO TABS
875-125 MG | ORAL_TABLET | Freq: Two times a day (BID) | ORAL | 0 refills | Status: AC
Start: 2022-08-09 — End: 2022-08-19

## 2022-08-09 MED ORDER — DEXAMETHASONE SOD PHOSPHATE PF 10 MG/ML IJ SOLN
10 MG/ML | Freq: Once | INTRAMUSCULAR | Status: AC
Start: 2022-08-09 — End: 2022-08-09
  Administered 2022-08-09: 07:00:00 8 mg via INTRAVENOUS

## 2022-08-09 MED FILL — DEXAMETHASONE SOD PHOSPHATE PF 10 MG/ML IJ SOLN: 10 mg/mL | INTRAMUSCULAR | Qty: 1

## 2022-08-09 NOTE — ED Provider Notes (Signed)
EMERGENCY DEPARTMENT ENCOUNTER    Pt Name: Heather Huerta  MRN: [id number]  Birthdate 25-Oct-1980  Date of evaluation: 08/09/22  CHIEF COMPLAINT       Chief Complaint   Patient presents with    Pharyngitis     Reports ongoing sore throat; reports tylenol 45 minutes PTA     HISTORY OF PRESENT ILLNESS   41 year old female presents emergency room for sore throat.  Patient has had sore throat for about 6 days now.  Patient's daughter had viral illness about a week to week and a half ago.  Daughter recovered without issues.  Patient started with conjunctivitis symptoms and sore throat last week.  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  Sore throat worsened significantly today and due to the increase in pain patient decided to come be evaluated here in the emergency room.  Patient denies rash fever or abdominal pain.  Patient denies cough.             REVIEW OF SYSTEMS     Review of Systems   Constitutional:  Negative for fever.   HENT:  Positive for sore throat.    Gastrointestinal:  Negative for abdominal pain.   Skin:  Negative for rash.     PASTMEDICAL HISTORY   History reviewed. No pertinent past medical history.  Past Problem List  Patient Active Problem List   Diagnosis Code    Missed ab O02.1     SURGICAL HISTORY       Past Surgical History:   Procedure Laterality Date    INTRAUTERINE DEVICE INSERTION N/A 08/09/2018    MIrena insertion NDC [id number].Tu02bthExp.Mar.2022     CURRENT MEDICATIONS       Previous Medications    IBUPROFEN (ADVIL;MOTRIN) 800 MG TABLET    Take 1 tablet by mouth every 8 hours as needed for Pain    ONDANSETRON (ZOFRAN-ODT) 4 MG DISINTEGRATING TABLET    Take 1 tablet by mouth every 8 hours as needed for Nausea or Vomiting     ALLERGIES     has No Known Allergies.  FAMILY HISTORY     has no family status information on file.      SOCIAL HISTORY       Social History     Tobacco Use    Smoking status: Never    Smokeless tobacco: Never   Vaping  Use    Vaping Use: Never used   Substance Use Topics    Alcohol use: Not Currently    Drug use: Never     PHYSICAL EXAM     INITIAL VITALS: BP 131/89   Pulse 94   Temp 98.6 F (37 C) (Oral)   Resp 17   SpO2 97%    Physical Exam  Constitutional:       General: She is not in acute distress.     Appearance: She is well-developed.   HENT:      Head: Normocephalic.      Mouth/Throat:      Pharynx: Posterior oropharyngeal erythema present. No pharyngeal swelling.      Tonsils: No tonsillar exudate or tonsillar abscesses. 0 on the right. 0 on the left.      Comments: Patient has erythema to the back of the throat.  No tonsillar or peritonsillar swelling.  Eyes:      Pupils: Pupils are equal, round, and reactive to light.   Cardiovascular:      Rate and Rhythm:  Normal rate and regular rhythm.      Heart sounds: Normal heart sounds.   Pulmonary:      Effort: Pulmonary effort is normal. No respiratory distress.      Breath sounds: Normal breath sounds.   Abdominal:      General: Bowel sounds are normal.      Palpations: Abdomen is soft.      Tenderness: There is no abdominal tenderness.   Musculoskeletal:         General: Normal range of motion.   Skin:     General: Skin is warm and dry.   Neurological:      Mental Status: She is alert and oriented to person, place, and time.         MEDICAL DECISION MAKING / ED COURSE:   Summary of Patient Presentation:        1)  Number and Complexity of Problems  Problem List This Visit: Sore throat    Differential Diagnosis: Viral pharyngitis    Diagnoses Considered but Do Not Suspect: Strep throat, tonsillar abscess, retropharyngeal abscess    Pertinent Comorbid Conditions:  NA    2)  Data Reviewed  NA    See more data below for the lab and radiology tests and orders.    3)  Treatment and Disposition    Alert oriented nondistressed 41 year old female presenting to the emergency room for sore throat.    Patient repeat assessment: Patient remained overall clinically stable here in the  ED.  No significant deterioration.    Disposition discussion with patient/family: Plan is for discharge.  Patient Heather continue self treating with Tylenol and Motrin for home.  We discussed starting Augmentin if symptoms worsen.    Shared Decision Making: Patient involved in testing.  After results were available here in the ED we discussed CT soft tissue neck.  We both agree this is likely low yield at this time.  Patient aware she can return anytime for worsening symptoms.      "ED Course" Notes From Epic Narrator:         CRITICAL CARE:       PROCEDURES:    Procedures      DATA FOR LAB AND RADIOLOGY TESTS ORDERED BELOW ARE REVIEWED BY THE ED CLINICIAN:    RADIOLOGY: All x-rays, CT, MRI, and formal ultrasound images (except ED bedside ultrasound) are read by the radiologist, see reports below, unless otherwise noted in MDM or here.  Reports below are reviewed by myself.  No orders to display       LABS: Lab orders shown below, the results are reviewed by myself, and all abnormals are listed below.  Labs Reviewed   CBC WITH AUTO DIFFERENTIAL - Abnormal; Notable for the following components:       Result Value    RBC 3.77 (*)     Hemoglobin 11.6 (*)     Hematocrit 35.2 (*)     All other components within normal limits   C-REACTIVE PROTEIN - Abnormal; Notable for the following components:    CRP 20.4 (*)     All other components within normal limits       Vitals Reviewed:    Vitals:    08/09/22 0045 08/09/22 0146   BP:  131/89   Pulse: 94    Resp: 17    Temp: 98.6 F (37 C)    TempSrc: Oral    SpO2: 97%      MEDICATIONS GIVEN TO PATIENT THIS ENCOUNTER:  Orders Placed This Encounter   Medications    dexamethasone (PF) (DECADRON) injection 8 mg    amoxicillin-clavulanate (AUGMENTIN) 875-125 MG per tablet     Sig: Take 1 tablet by mouth 2 times daily for 10 days     Dispense:  20 tablet     Refill:  0     DISCHARGE PRESCRIPTIONS:  New Prescriptions    AMOXICILLIN-CLAVULANATE (AUGMENTIN) 875-125 MG PER TABLET    Take 1  tablet by mouth 2 times daily for 10 days     PHYSICIAN CONSULTS ORDERED THIS ENCOUNTER:  None  FINAL IMPRESSION      1. Sore throat          DISPOSITION/PLAN   DISPOSITION Decision To Discharge 08/09/2022 02:17:11 AM      OUTPATIENT FOLLOW UP THE PATIENT:  No follow-up provider specified.    Cicero Duck, MD       Cicero Duck, MD  08/09/22 Rogene [place]

## 2022-08-09 NOTE — Discharge Instructions (Signed)
You may return to the emergency room at any time if symptoms significantly worsen.    As we discussed symptoms are likely viral at this time.  You may start antibiotics when you see fit.

## 2022-08-09 NOTE — ED Notes (Signed)
Pt came into ED complaining of a ongoing sore throat. Pt denies SOB, chest pain, or vision changes. Pt is alert and orientedx4 Pt is able to ambulate without assistance. Pt is on room air, vitals are stable, will continue to monitor.     Elisabeth Most, RN  08/09/22 0225

## 2022-10-02 NOTE — Telephone Encounter (Signed)
Pt is calling around to find a provider who could perform a HIS or SIS procedure as soon as possible. She is currently receiving orders from IV[facility] place from [place].     Pt wanted to know if providers in the office would be willing to perform procedures.     Please Advise

## 2022-10-05 ENCOUNTER — Telehealth
Admit: 2022-10-05 | Discharge: 2022-10-05 | Payer: PRIVATE HEALTH INSURANCE | Attending: Obstetrics & Gynecology | Primary: Diagnostic Radiology

## 2022-10-05 DIAGNOSIS — N926 Irregular menstruation, unspecified: Secondary | ICD-10-CM

## 2022-10-05 NOTE — Progress Notes (Signed)
Demesha Boorman   10/05/2022        Lannah Koike is a 42 y.o. female is a Established patient, presenting virtually for evaluation of the following:    1. Menses, irregular            TELEHEALTH EVALUATION -- Audio/Visual       She was here to follow-up regarding her labs and diagnostics ordered at her last visit for the diagnosis of:    ICD-10-CM    1. Menses, irregular  N92.6           She has any specific chief complaint today. Her bowels are regular and she is voiding without difficulty. Pt states her periods are irregular. Pt had an SAB 2022. Pt has had irregular periods since. Pt has been trying to conceive and has seen infertility specialists. Pt wishes to have a HSG.       No past medical history on file.      Past Surgical History:   Procedure Laterality Date    INTRAUTERINE DEVICE INSERTION N/A 08/09/2018    MIrena insertion Nitro [id number].Tu02bthExp.Mar.2022         No family history on file.      Social History     Tobacco Use    Smoking status: Never    Smokeless tobacco: Never   Vaping Use    Vaping Use: Never used   Substance Use Topics    Alcohol use: Not Currently    Drug use: Never         MEDICATIONS:  Current Outpatient Medications   Medication Sig Dispense Refill    ondansetron (ZOFRAN-ODT) 4 MG disintegrating tablet Take 1 tablet by mouth every 8 hours as needed for Nausea or Vomiting 30 tablet 0    ibuprofen (ADVIL;MOTRIN) 800 MG tablet Take 1 tablet by mouth every 8 hours as needed for Pain 60 tablet 0     No current facility-administered medications for this visit.         ALLERGIES:  Allergies as of 10/05/2022    (No Known Allergies)           Review Of Systems (11 point):  Constitutional: No fever, chills or malaise; No weight change or fatigue  Head and Eyes: No vision changes, Headache, Dizziness or trauma in last 12 months  ENT ROS: No hearing, Tinnitis, sinus or taste problems  Hematological and Lymphatic ROS:No Lymphoma, Von Willebrand's, Hemophillia or Bleeding History  Psych  ROS: No Depression, Homicidal thoughts,suicidal thoughts, or anxiety  Breast ROS: No breast abnormalities or lumps  Respiratory ROS: No SOB, Pneumoniae,Cough, or Pulmonary Embolism   Cardiovascular ROS: No Chest Pain with Exertion, Palpitations, Syncope, Edema, Arrhythmia  Gastrointestinal ROS: No Indigestion, Heartburn, Nausea, vomiting, Diarrhea, Constipation,or Bowel Changes; No Bloody Stools or melena  Genito-Urinary ROS: No Dysuria, Hematuria or Nocturia. No Urinary Incontinence or Vaginal Discharge  Musculoskeletal ROS: No Arthralgia, Arthritis,Gout,Osteoporosis or Rheumatism  Neurological ROS: No CVA, Migraines, Epilepsy, Seizure Hx, or Limb Weakness  Dermatological ROS: No Rash, Itching, Hives, Mole Changes or Cancer                                                      not currently breastfeeding.    PE is limited due to the virtual visit    Abdomen: Soft non-tender; good bowel sounds. No guarding,  rebound or rigidity. No CVA tenderness bilaterally reported when questioned. (Viewed Virtually)    Extremities: No calf tenderness, DTR 2/4, and No edema bilaterally as inspected by video and palpation by the patient (Viewed Virtually)    Pelvic: (Virtual Visit-Not Completed)    Diagnostics:  ordered    Lab Results:  No visits with results within 21 Day(s) from this visit.   Latest known visit with results is:   Admission on 08/09/2022, Discharged on 08/09/2022   Component Date Value Ref Range Status    WBC 08/09/2022 6.6  3.5 - 11.3 k/uL Final    RBC 08/09/2022 3.77 (L)  3.95 - 5.11 m/uL Final    Hemoglobin 08/09/2022 11.6 (L)  11.9 - 15.1 g/dL Final    Hematocrit 08/09/2022 35.2 (L)  36.3 - 47.1 % Final    MCV 08/09/2022 93.4  82.6 - 102.9 fL Final    MCH 08/09/2022 30.8  25.2 - 33.5 pg Final    MCHC 08/09/2022 33.0  28.4 - 34.8 g/dL Final    RDW 08/09/2022 13.0  11.8 - 14.4 % Final    Platelets 08/09/2022 224  138 - 453 k/uL Final    MPV 08/09/2022 11.1  8.1 - 13.5 fL Final    NRBC Automated 08/09/2022 0.0  0.0  per 100 WBC Final    Neutrophils % 08/09/2022 53  36 - 65 % Final    Lymphocytes % 08/09/2022 36  24 - 43 % Final    Monocytes % 08/09/2022 8  3 - 12 % Final    Eosinophils % 08/09/2022 2  1 - 4 % Final    Basophils % 08/09/2022 1  0 - 2 % Final    Immature Granulocytes 08/09/2022 0  0 % Final    Neutrophils Absolute 08/09/2022 3.48  1.50 - 8.10 k/uL Final    Lymphocytes Absolute 08/09/2022 2.37  1.10 - 3.70 k/uL Final    Monocytes Absolute 08/09/2022 0.49  0.10 - 1.20 k/uL Final    Eosinophils Absolute 08/09/2022 0.14  0.00 - 0.44 k/uL Final    Basophils Absolute 08/09/2022 0.06  0.00 - 0.20 k/uL Final    Absolute Immature Granulocyte 08/09/2022 0.01  0.00 - 0.30 k/uL Final    CRP 08/09/2022 20.4 (H)  0.0 - 5.0 mg/L Final   ]        Below is the assessment and plan developed based on review of pertinent history, physical exam, labs, studies, and medications.    ICD-10-CM    1. Menses, irregular  N92.6            Assessment:   Diagnosis Orders   1. Menses, irregular          Chief Complaint   Patient presents with    Follow-up         Patient Active Problem List    Diagnosis Date Noted    Missed ab 06/27/2021     Priority: Medium       PLAN:  Return in about 4 weeks (around 11/02/2022) for Follow up labs/diagnostics.  Schedule HSG  Return to the office in 3-4 weeks.  Barrier recommendations and STD counseling was completed  Counseled on preventative health maintenance follow-up.            The patient, Keyatta Tolles is a 42 y.o. female, was seen with a total time spent of 20 minutes for the visit on this date of service by the E/M provider. The time component had both face to  face and non face to face time spent in determining the total time component.  Counseling and education regarding her diagnosis listed below and her options regarding those diagnoses were also included in determining her time component.     1. Menses, irregular            The patient had her preventative health maintenance recommendations and  follow-up reviewed with her at the completion of her visit.          Landis Gandy, was evaluated through a synchronous (real-time) audio-video encounter. The patient (or guardian if applicable) is aware that this is a billable service, which includes applicable co-pays. This Virtual Visit was conducted with patient's (and/or legal guardian's) consent. Patient identification was verified, and a caregiver was present when appropriate. The patient (and/or legal guardian) has also been advised to contact this office for worsening conditions or problems, and seek emergency medical treatment and/or call 911 if deemed necessary.  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 Confirm you are appropriately licensed, registered, or certified to deliver care in the state where the patient is located as indicated above. If you are not or unsure, please re-schedule the visit.    Are you appropriately licensed in the patient's state?: Yes         Electronically signed by Wylie Hail, DO on 10/05/2022 at 11:10 AM    An electronic signature was used to authenticate this note.

## 2022-10-06 ENCOUNTER — Telehealth

## 2022-10-06 NOTE — Telephone Encounter (Signed)
Patient notified of procedure being scheduled. Informed patient of abstaining from intercourse 2 weeks prior and HCG that will need drawn day before. Patient expressed understanding.

## 2022-10-15 ENCOUNTER — Ambulatory Visit: Payer: PRIVATE HEALTH INSURANCE | Primary: Diagnostic Radiology

## 2022-10-15 ENCOUNTER — Encounter
Payer: BLUE CROSS/BLUE SHIELD | Attending: Obstetrics & Gynecology | Primary: Student in an Organized Health Care Education/Training Program

## 2022-10-16 ENCOUNTER — Encounter
Admit: 2022-10-16 | Discharge: 2022-10-16 | Payer: PRIVATE HEALTH INSURANCE | Attending: Student in an Organized Health Care Education/Training Program | Primary: Diagnostic Radiology

## 2022-10-16 DIAGNOSIS — Z0000 Encounter for general adult medical examination without abnormal findings: Secondary | ICD-10-CM

## 2022-10-16 DIAGNOSIS — J330 Polyp of nasal cavity: Secondary | ICD-10-CM

## 2022-10-16 NOTE — Progress Notes (Signed)
[facility]  [facility]  [place] [id number]     Date of Visit:  10/17/2022  Patient Name: Heather Huerta   Patient DOB:  12/29/1980     CHIEF COMPLAINT:     Heather Huerta is a 42 y.o. female who presents today for an general visit to be evaluated for the following condition(s):  Chief Complaint   Patient presents with    New Patient     Previous pcp unknown but went to  [facility] [place]     Annual Exam       REVIEW OF SYSTEM      Review of Systems   All other systems reviewed and are negative.      HISTORY OF PRESENT ILLNESS     HPI  Diet: Eats about 20 grams of fiber. Does not drink any soda. Drinks 64 ounces of water.  Does not eat red meat. Does have a sweet tooth     Exercise:  Does not exercise at the moment but is planning to restart.      Sleep:  Patient states that she snores really loudly, . Patient states that she has had polyps in her nose and uses flonase. Patient states that she feels refreshed when she wakes up. Sleeps >6 hours a night.         REVIEWED INFORMATION      No Known Allergies    Patient Active Problem List   Diagnosis    Missed ab    Physical exam    Nasal polyp       No past medical history on file.    Past Surgical History:   Procedure Laterality Date    INTRAUTERINE DEVICE INSERTION N/A 08/09/2018    MIrena insertion [employer] [id number].Tu02bthExp.Mar.2022        Social History     Socioeconomic History    Marital status: Unknown     Spouse name: None    Number of children: None    Years of education: None    Highest education level: None   Tobacco Use    Smoking status: Never    Smokeless tobacco: Never   Vaping Use    Vaping Use: Never used   Substance and Sexual Activity    Alcohol use: Not Currently    Drug use: Never    Sexual activity: Yes     Partners: Male     Social Determinants of Health     Financial Resource Strain: Low Risk  (10/16/2022)    Overall Financial Resource Strain (CARDIA)     Difficulty of Paying Living Expenses:  Not hard at all   Food Insecurity: No Food Insecurity (10/16/2022)    Hunger Vital Sign     Worried About Running Out of Food in the Last Year: Never true     Ran Out of Food in the Last Year: Never true   Transportation Needs: Unknown (10/16/2022)    PRAPARE - Transportation     Lack of Transportation (Non-Medical): No   Housing Stability: Unknown (10/16/2022)    Housing Stability Vital Sign     Unstable Housing in the Last Year: No        PHYSICAL EXAM     BP 118/70 (Site: Left Upper Arm, Position: Sitting, Cuff Size: Medium Adult)   Pulse 78   Temp 97.4 F (36.3 C) (Temporal)   Ht 1.753 m (5' 9"$ )   Wt 99.2 kg (218 lb 9.6 oz)  SpO2 98%   BMI 32.28 kg/m    Physical Exam  Vitals and nursing note reviewed.   HENT:      Head: Normocephalic.      Right Ear: Tympanic membrane normal.      Left Ear: Tympanic membrane normal.      Nose:      Right Turbinates: Enlarged.      Left Turbinates: Enlarged.      Comments: Nasal polyp right  Eyes:      Conjunctiva/sclera: Conjunctivae normal.   Cardiovascular:      Rate and Rhythm: Normal rate and regular rhythm.      Heart sounds: No murmur heard.  Pulmonary:      Effort: Pulmonary effort is normal.   Abdominal:      General: Abdomen is flat. Bowel sounds are normal.      Palpations: Abdomen is soft.      Tenderness: There is no abdominal tenderness.   Musculoskeletal:         General: Normal range of motion.      Cervical back: Normal range of motion and neck supple.   Neurological:      Mental Status: She is alert and oriented to person, place, and time. Mental status is at baseline.   Psychiatric:         Mood and Affect: Mood normal.         Behavior: Behavior normal.         ASSESSMENT/PLAN     1. Physical exam  Assessment & Plan:   Physical exam: Stable  Diet: Advised high fiber diet of 20 gram a day   Advised decreasing sweets  Exercise: Advised moderate intensity exercise (brisk walking) 150 minutes a week or at least 5,000-10,000 steps a day.  Sleep: 6-8 hours  advised.  Referral to ENT for turbinoplasty.  No data recorded    2. Nasal polyp  Assessment & Plan:  A/P: chronic  -small possible polyp right nostril, enlarged turbinates  -referral to ENT for turbinoplasty/polypectomy  Orders:  -     [facility], [place]  3. Screening for deficiency anemia  -     CBC with Auto Differential; Future  4. Screening for diabetes mellitus  -     Hemoglobin A1C; Future  -     Comprehensive Metabolic Panel; Future  5. Screening for lipid disorders  -     Lipid Panel; Future  6. Screening for thyroid disorder  -     TSH with Reflex; Future  7. Encounter for vitamin deficiency screening  -     Vitamin D 25 Hydroxy; Future  -     Vitamin B12 & Folate; Future  8. Need for hepatitis C screening test  -     Hepatitis C Antibody; Future       Return in about 1 year (around 10/17/2023) for CPE.    COMMUNICATION:       Electronically signed by Benetta Spar, MD on 10/17/2022 at 8:16 PM

## 2022-10-17 DIAGNOSIS — Z0000 Encounter for general adult medical examination without abnormal findings: Secondary | ICD-10-CM

## 2022-10-17 NOTE — Assessment & Plan Note (Signed)
Physical exam: Stable  Diet: Advised high fiber diet of 20 gram a day   Advised decreasing sweets  Exercise: Advised moderate intensity exercise (brisk walking) 150 minutes a week or at least 5,000-10,000 steps a day.  Sleep: 6-8 hours advised.  Referral to ENT for turbinoplasty.  No data recorded

## 2022-10-17 NOTE — Assessment & Plan Note (Signed)
A/P: chronic  -small possible polyp right nostril, enlarged turbinates  -referral to ENT for turbinoplasty/polypectomy

## 2022-10-21 ENCOUNTER — Encounter
Payer: BLUE CROSS/BLUE SHIELD | Attending: Obstetrics & Gynecology | Primary: Student in an Organized Health Care Education/Training Program

## 2022-11-09 NOTE — Telephone Encounter (Signed)
From: Landis Gandy  To: Dr. Wylie Hail  Sent: 11/09/2022 2:37 AM EDT  Subject: Scheduling Hysterosalpingogram     Hello,  I would like to schedule a HSG, 03/15 was day of menstrual cycle. I prefer Thursday 03/21 or Friday 03/22 if possible. Thank you!

## 2022-12-04 ENCOUNTER — Ambulatory Visit
Admit: 2022-12-04 | Discharge: 2022-12-04 | Payer: PRIVATE HEALTH INSURANCE | Attending: Student in an Organized Health Care Education/Training Program | Primary: Student in an Organized Health Care Education/Training Program

## 2022-12-04 DIAGNOSIS — R0683 Snoring: Secondary | ICD-10-CM

## 2022-12-04 MED ORDER — OXYMETAZOLINE HCL 0.05 % NA SOLN
0.05 | Freq: Once | NASAL | Status: AC
Start: 2022-12-04 — End: 2022-12-04
  Administered 2022-12-04: 13:00:00 2 via NASAL

## 2022-12-04 MED ORDER — LIDOCAINE HCL 4 % EX SOLN
4 | Freq: Once | CUTANEOUS | Status: AC
Start: 2022-12-04 — End: 2022-12-04
  Administered 2022-12-04: 13:00:00 via TOPICAL

## 2022-12-04 NOTE — Progress Notes (Signed)
[facility] @ [address] [place]  Consult Visit Note    Referring Provider: Julianne Handler, MD  [address]  [address]  [place],  [place] [address]       HPI:  Ms Arnita Maiorano is a 42 y.o. female presents for evaluation of snoring. Patient states she has been snoring for years now, and that she has not noticed it worsening. States she has associated restless sleep and mouth breathing, but has not noticed any apneic episodes. Also reports she notices she is mouth breathing throughout the day and that her left nostril seems more clogged than her right. Has not had a previous sleep study. Patient reports she has seasonal allergies and uses oral antihistamines along with Flonase. She has not previously been allergy tested.     ROS:  10 point ROS performed, pertinent findings documented in HPI.    Current Outpatient Medications on File Prior to Visit   Medication Sig Dispense Refill    Multiple Vitamins-Minerals (WOMENS MULTI GUMMIES PO) Womens Multi      ibuprofen (ADVIL;MOTRIN) 800 MG tablet Take 1 tablet by mouth every 8 hours as needed for Pain 60 tablet 0    ondansetron (ZOFRAN-ODT) 4 MG disintegrating tablet Take 1 tablet by mouth every 8 hours as needed for Nausea or Vomiting (Patient not taking: Reported on 12/04/2022) 30 tablet 0     No current facility-administered medications on file prior to visit.       No Known Allergies    No past medical history on file.    Past Surgical History:   Procedure Laterality Date    INTRAUTERINE DEVICE INSERTION N/A 08/09/2018    MIrena insertion NDC [id number].Tu02bthExp.Mar.2022       Family History   Problem Relation Age of Onset    Heart Attack Mother     High Cholesterol Brother        Social History     Tobacco Use    Smoking status: Never    Smokeless tobacco: Never   Vaping Use    Vaping Use: Never used   Substance Use Topics    Alcohol use: Not Currently    Drug use: Never       Physical Exam:  Vitals:    12/04/22  0838   BP: 103/71   Pulse: 84   Temp: 97.2 F (36.2 C)      GEN - NAD  NEURO - CN 2-12 intact  HEAD - Normocephalic, atraumatic.  FACE - Symmetric, sinuses non-tender to palpation.   EYES - PERRL, EOMI  EARS: Externally normal   NOSE - The nares are patent. No rhinorrhea.  Septum is midline with small nonobstructive bilateral anterior septal spurs.  OC - Anterior and lateral tongue, buccal mucosa, mandibular and maxillary gingiva, RMT, FOM, hard palate without lesions and mucosa moist.   OP - Uvula midline, palate elevates symmetrically, tonsils 2+, nonobstructive. BOT soft  NECK - Supple. Trachea is midline. No palpable LAD. No palpable masses bilaterally.   RESP - Non-labored. No stridor.  Voice is strong.    NASOPHARYNGOLARYNGOSCOPY:  After verbal consent was obtained, comprehensive upper airway exam was performed using a flexible fiberoptic scope under topical anesthesia. The naris was anesthetized using oxymetazoline with lidocaine nasal spray. The endoscope was passed gently along in the inferior border of the nose. The nasal cavity showed mildly enlarged inferior turbinates, and septal spurs. Nasopharynx  without mass or lesion. Base of tongue, vallecula, lateral  pharyngeal walls without lesions. Base of tongue and epiglottis shown touching. Epiglottis, AE folds, arytenoids, false cords without lesions. Pyriform sinuses, postcricoid, posterior pharyngeal wall without lesions. Vocal cords are symmetrically mobile with full range of motion, no lesions present. Subglottis visualized and without lesions    Assessment/Plan  Ms. Addalyn Dikes is a 42 y.o. female as above with complaints of snoring.     - Obtain sleep study.  Will call patient with results of sleep study  - Continue using flonase and oral antihistamines.  - Begin to use nasal saline irrigation.  NeilMed sample bottle with instructions on use given to patient today.  - RTC if concerns arise sooner.     This note was created using voice-to-text  dictation.  Although the note was reviewed by the author, please excuse any inaccurate transcriptions that may have been overlooked prior to the signature. Please contact the Thereasa Parkin of this note with any questions regarding the transcription.      Hermenia Fiscal, MD  Otolaryngology - Head and Neck Surgery  [facility] @ [address] [facility]

## 2022-12-04 NOTE — Progress Notes (Unsigned)
Heather Huerta is here today, they are being seen for   Chief Complaint   Patient presents with    New Patient     Nasal polyh       Immunizations: up to date and documented   Stated as up to date, no records available.     Any vision or hearing concerns:YES/NO: no    Any concerns for safety at home:  YES/NO: no    Tobacco/vape use: YES/NO: no    Alcohol use: YES/NO: no    Hospitalizations: see EPIC documentation    Prior Surgeries: see EPIC documentation    Medications & Herbal Supplements: see EPIC documentation    ENT Bleeding Risk Assessment Questionnaire    1:  History of Epistaxis?    0- No history of nosebleeds    2: Excessive bleeding after tooth extraction?     0- No excessive bleeding after tooth extraction    3: Issues with post-surgical bleeding (including circumcision)?    0- No postoperative bleeding issues    5: Family history of known bleeding disorder in parent or sibling?     0- No    6: Do you typically have more than 5 bruises present at any given time?   0- No    7: If menstruating, is there a history of heavy bleeding (menorrhagia), changing pads more often than every 2 hours, clots/ flooding present, and/ or menses lasting more than 7 days?    0- No or not applicable        SCORE of 3 or GREATER, RECOMMEND HEMATOLOGY CONSULTATION PRE-OP, CBC and vonWILLEBRAND PANEL WITH PLATELET FUNCTION ANALYSIS.    Snoring                     Snoring/Sleep disturbance    Night time symptoms:    Snoring:  yes    Duration: long term   Restless sleep: yes   Mouth breathing: yes   Apneic episodes: no    Daytime symptoms:    Difficulty waking: yes   Mouth breathing during the day: yes      Previous Sleep study: No    Previous medical therapy: yes   If yes, medications used:  fluticasone   If yes, did this improve symptoms:  no        Left clogged for a long time, Flonase does not help.

## 2022-12-14 ENCOUNTER — Inpatient Hospital Stay: Payer: PRIVATE HEALTH INSURANCE | Primary: Student in an Organized Health Care Education/Training Program

## 2022-12-14 ENCOUNTER — Encounter

## 2022-12-14 DIAGNOSIS — Z1321 Encounter for screening for nutritional disorder: Secondary | ICD-10-CM

## 2022-12-15 ENCOUNTER — Inpatient Hospital Stay
Admit: 2022-12-15 | Discharge: 2023-07-02 | Payer: PRIVATE HEALTH INSURANCE | Attending: Obstetrics & Gynecology | Primary: Student in an Organized Health Care Education/Training Program

## 2022-12-15 DIAGNOSIS — N926 Irregular menstruation, unspecified: Secondary | ICD-10-CM

## 2022-12-15 LAB — COMPREHENSIVE METABOLIC PANEL
ALT: 9 U/L — ABNORMAL LOW (ref 10–35)
AST: 24 U/L (ref 10–35)
Albumin/Globulin Ratio: 2 (ref 1.0–2.5)
Albumin: 4.6 g/dL (ref 3.5–5.2)
Alkaline Phosphatase: 44 U/L (ref 35–104)
Anion Gap: 11 mmol/L (ref 9–16)
BUN: 12 mg/dL (ref 6–20)
CO2: 24 mmol/L (ref 20–31)
Calcium: 9.6 mg/dL (ref 8.6–10.4)
Chloride: 103 mmol/L (ref 98–107)
Creatinine: 0.9 mg/dL (ref 0.50–0.90)
Est, Glom Filt Rate: 81 mL/min/{1.73_m2} (ref >60)
Glucose: 89 mg/dL (ref 74–99)
Potassium: 4.3 mmol/L (ref 3.7–5.3)
Sodium: 138 mmol/L (ref 136–145)
Total Bilirubin: 0.3 mg/dL (ref 0.00–1.20)
Total Protein: 7.5 g/dL (ref 6.6–8.7)

## 2022-12-15 LAB — CBC WITH AUTO DIFFERENTIAL
Basophils %: 1 % (ref 0–2)
Basophils Absolute: 0.06 10*3/uL (ref 0.00–0.20)
Eosinophils %: 3 % (ref 1–4)
Eosinophils Absolute: 0.13 10*3/uL (ref 0.00–0.44)
Hematocrit: 35.3 % — ABNORMAL LOW (ref 36.3–47.1)
Hemoglobin: 11 g/dL — ABNORMAL LOW (ref 11.9–15.1)
Immature Granulocytes %: 0 %
Immature Granulocytes Absolute: <0.03 10*3/uL (ref 0.00–0.30)
Lymphocytes %: 41 % (ref 24–43)
Lymphocytes Absolute: 2.01 10*3/uL (ref 1.10–3.70)
MCH: 29.6 pg (ref 25.2–33.5)
MCHC: 31.2 g/dL (ref 28.4–34.8)
MCV: 95.1 fL (ref 82.6–102.9)
MPV: 12.1 fL (ref 8.1–13.5)
Monocytes %: 7 % (ref 3–12)
Monocytes Absolute: 0.35 10*3/uL (ref 0.10–1.20)
NRBC Automated: 0 per 100 WBC
Neutrophils %: 48 % (ref 36–65)
Neutrophils Absolute: 2.4 10*3/uL (ref 1.50–8.10)
Platelets: 202 10*3/uL (ref 138–453)
RBC: 3.71 m/uL — ABNORMAL LOW (ref 3.95–5.11)
RDW: 14.6 % — ABNORMAL HIGH (ref 11.8–14.4)
WBC: 5 10*3/uL (ref 3.5–11.3)

## 2022-12-15 LAB — LUTEINIZING HORMONE: LH: 5.4 m[IU]/mL (ref 1.7–8.6)

## 2022-12-15 LAB — HEMOGLOBIN A1C
Estimated Avg Glucose: 123 mg/dL
Hemoglobin A1C: 5.9 % (ref 4.0–6.0)

## 2022-12-15 LAB — TESTOSTERONE: Testosterone: 13 ng/dL (ref 8–48)

## 2022-12-15 LAB — HEPATITIS C ANTIBODY: Hepatitis C Ab: NONREACTIVE

## 2022-12-15 LAB — RUBELLA ANTIBODY, IGG: Rubella Antibody, IgG: 23.8 IU/mL

## 2022-12-15 LAB — VITAMIN B12 & FOLATE
Folate: >20 ng/mL (ref 4.8–24.2)
Vitamin B-12: 756 pg/mL (ref 232–1245)

## 2022-12-15 LAB — T. PALLIDUM AB: T. pallidum, IgG: NONREACTIVE

## 2022-12-15 LAB — HCG, QUANTITATIVE, PREGNANCY: hCG Quant: <0.2 m[IU]/mL (ref 0–7)

## 2022-12-15 LAB — HIV SCREEN: HIV Ag/Ab: NONREACTIVE

## 2022-12-15 LAB — FOLLICLE STIMULATING HORMONE: FSH: 6.5 m[IU]/mL

## 2022-12-15 LAB — DHEA-SULFATE: DHEAS (DHEA Sulfate): 194 ug/dL (ref 60.9–337)

## 2022-12-15 LAB — PROLACTIN: Prolactin: 54.8 ng/mL — ABNORMAL HIGH (ref 4.79–23.3)

## 2022-12-15 LAB — HEPATITIS B SURFACE ANTIGEN: Hepatitis B Surface Ag: NONREACTIVE

## 2022-12-15 LAB — ABO/RH: ABO/Rh: B POS

## 2022-12-15 LAB — ESTRADIOL: Estradiol: 77 pg/mL

## 2022-12-15 LAB — PROGESTERONE: Progesterone: 0.15 ng/mL

## 2022-12-15 LAB — VITAMIN D 25 HYDROXY: Vit D, 25-Hydroxy: 26.4 ng/mL — ABNORMAL LOW (ref 30.0–100.0)

## 2022-12-15 LAB — TSH: TSH: 2.08 u[IU]/mL (ref 0.27–4.20)

## 2022-12-15 MED ORDER — IOPAMIDOL 61 % IV SOLN
61 | Freq: Once | INTRAVENOUS | Status: AC | PRN
Start: 2022-12-15 — End: 2022-12-15
  Administered 2022-12-15: 15:00:00 30 mL

## 2022-12-15 NOTE — Procedures (Signed)
HYSTEROSALPINGOGRAM WORKSHEET   [facility]  [address], [address]  [place], [place] [address]  [phone] [place]n  [phone] fax           Heather Huerta  12/15/2022  11:06 AM      Indication for Procedure:      irregular menses/ infertility       Vaginal Discharge: No  Cultures Collected: No            Heather Huerta is a 42 y.o. female G3P2003 she is to have a hysterosalpingogram to evaluate her fallopian tubes regarding their patency.  She had a Quantitative Beta HCG completed prior to the procedure and it was found to be negative.      No past medical history on file.      Past Surgical History:   Procedure Laterality Date    INTRAUTERINE DEVICE INSERTION N/A 08/09/2018    MIrena insertion NDC [id number].Tu02bthExp.Mar.2022         Family History   Problem Relation Age of Onset    Heart Attack Mother     High Cholesterol Brother          Social History     Tobacco Use    Smoking status: Never    Smokeless tobacco: Never   Vaping Use    Vaping Use: Never used   Substance Use Topics    Alcohol use: Not Currently    Drug use: Never         MEDICATIONS:  @    ALLERGIES:  Allergies as of 12/15/2022    (No Known Allergies)         A time out was called by the Chaperone listed above while ALL participants were quiet and attentive.  The procedure to be completed was confirmed, with the appropriate laterality demarcated prior, if appropriate, as well as the patients name and a unique identifier, her date of birth.  All questions were answered to her satisfaction.  The consent was signed prior to the time out and all the risks were discussed in detail.       Physical Exam:    There were no vitals filed for this visit.    Respiratory:  The lungs were auscultated and found to be clear. There were no rales, rhonchi or wheezes. There was a good respiratory effort.    Cardiovascular:  The heart was in a regular rate and rhythm. . No S3 or S4. There was no murmur appreciated. Location, grade, and radiation are  not applicable.    Abdomen: Soft non-tender; good bowel sounds. No guarding, rebound or rigidity. No CVA tenderness bilaterally.    Extremities: No calf tenderness, DTR 2/4, and No edema bilaterally    Pelvic:    Vulva and vagina appear normal. Bimanual exam reveals normal uterus and adnexa.            Lab Results:  Results for orders placed or performed during the hospital encounter of 12/14/22   Vitamin B12 & Folate   Result Value Ref Range    Vitamin B-12 756 232 - 1245 pg/mL    Folate >20.0 4.8 - 24.2 ng/mL           Assessment:  Irregular menses  Patient Active Problem List    Diagnosis Date Noted    Missed ab 06/27/2021     Priority: Medium    Nasal polyp 10/17/2022         PLAN:  Return to the office in 2 weeks.  Next annual is 6 months.  Barrier Recommendations and STD precautions were discussed.  Counseled on preventative health maintenance follow-up.     The patient was counseled on abstinence for 72 hours. She is to report any vaginal bleeding or vaginal discharge. If she is short of breath or begins to have any rash on her body she is to notify her physician immediately or go to the nearest emergency department. She was informed this may be the beginning of a serious/life threatening allergic reaction.    Radiologist Performing Procedure: Dr. Emmit Pomfret    Findings:  Uterine Cavity: normal  Spillage from Fallopian Tubes: Yes      Patient was seen with total face to face time of 20 minutes. More than 50% of this visit was counseling and education regarding The encounter diagnosis was Menses, irregular. and No chief complaint on file.   as well as  counseling on preventative health maintenance follow-up.

## 2022-12-16 LAB — VARICELLA ZOSTER ANTIBODY, IGG: Varicella Zoster Ab IgG: 2.46 (ref >1.09)

## 2022-12-17 ENCOUNTER — Encounter

## 2022-12-17 LAB — ANTI MULLERIAN HORMONE: Anti-Mullerian Hormone: 0.657 ng/mL (ref <=6.282)

## 2022-12-18 NOTE — Telephone Encounter (Signed)
Patient requesting RX for [facility]

## 2022-12-21 ENCOUNTER — Inpatient Hospital Stay: Payer: PRIVATE HEALTH INSURANCE | Primary: Student in an Organized Health Care Education/Training Program

## 2022-12-21 DIAGNOSIS — R7989 Other specified abnormal findings of blood chemistry: Secondary | ICD-10-CM

## 2022-12-22 ENCOUNTER — Encounter

## 2022-12-22 LAB — PROLACTIN: Prolactin: 17.5 ng/mL (ref 4.79–23.3)

## 2022-12-23 ENCOUNTER — Encounter

## 2022-12-23 MED ORDER — OZEMPIC (0.25 OR 0.5 MG/DOSE) 2 MG/1.5ML SC SOPN
21.5 MG/1.5ML | SUBCUTANEOUS | 2 refills | Status: AC
Start: 2022-12-23 — End: 2023-03-18

## 2022-12-23 NOTE — Telephone Encounter (Signed)
Patient said yes she will try Ozempic. Please send to pharmacy, thank you

## 2022-12-23 NOTE — Telephone Encounter (Signed)
Patient notified

## 2023-02-18 ENCOUNTER — Encounter

## 2023-02-19 ENCOUNTER — Inpatient Hospital Stay: Payer: PRIVATE HEALTH INSURANCE | Primary: Student in an Organized Health Care Education/Training Program

## 2023-02-19 DIAGNOSIS — N926 Irregular menstruation, unspecified: Secondary | ICD-10-CM

## 2023-02-19 LAB — HCG, QUANTITATIVE, PREGNANCY: hCG Quant: 18338 m[IU]/mL — ABNORMAL HIGH (ref 0–7)

## 2023-02-22 ENCOUNTER — Telehealth

## 2023-02-22 NOTE — Telephone Encounter (Signed)
-----   Message from Nicholes Rough, APRN - CNP sent at 02/22/2023  8:08 AM EDT -----  Positive quant HCG  Please schedule OB ultrasound at hospital for fetal viability.  Prenatal vitamin 1 PO QD with 12 refills.

## 2023-02-22 NOTE — Telephone Encounter (Signed)
Patient notified of results. LMP was 5-16 patient taking prenatal vitamins. Patient given number to schedule ultrasound, to call back to schedule intake after

## 2023-02-23 ENCOUNTER — Ambulatory Visit: Payer: PRIVATE HEALTH INSURANCE | Primary: Student in an Organized Health Care Education/Training Program

## 2023-02-23 ENCOUNTER — Inpatient Hospital Stay
Admit: 2023-02-23 | Payer: PRIVATE HEALTH INSURANCE | Primary: Student in an Organized Health Care Education/Training Program

## 2023-02-23 DIAGNOSIS — Z3490 Encounter for supervision of normal pregnancy, unspecified, unspecified trimester: Secondary | ICD-10-CM

## 2023-02-24 ENCOUNTER — Emergency Department
Admit: 2023-02-24 | Payer: PRIVATE HEALTH INSURANCE | Primary: Student in an Organized Health Care Education/Training Program

## 2023-02-24 ENCOUNTER — Inpatient Hospital Stay
Admit: 2023-02-24 | Discharge: 2023-02-24 | Disposition: A | Discharge status: Home / Self Care | Payer: PRIVATE HEALTH INSURANCE | Attending: Emergency Medicine

## 2023-02-24 DIAGNOSIS — O209 Hemorrhage in early pregnancy, unspecified: Secondary | ICD-10-CM

## 2023-02-24 DIAGNOSIS — O4690 Antepartum hemorrhage, unspecified, unspecified trimester: Secondary | ICD-10-CM

## 2023-02-24 LAB — HCG, QUANTITATIVE, PREGNANCY: hCG Quant: 32175 m[IU]/mL — ABNORMAL HIGH (ref 0–7)

## 2023-02-24 LAB — URINALYSIS WITH MICROSCOPIC
Bilirubin, Urine: NEGATIVE
Casts UA: 5 /LPF (ref 0–8)
Epithelial Cells, UA: 5 /HPF (ref 0–5)
Glucose, Ur: NEGATIVE mg/dL
Nitrite, Urine: NEGATIVE
RBC, UA: 20 /HPF (ref 0–4)
Specific Gravity, UA: 1.027 (ref 1.005–1.030)
Urobilinogen, Urine: NORMAL EU/dL (ref 0.0–1.0)
WBC, UA: 50 /HPF (ref 0–5)
pH, Urine: 5.5 (ref 5.0–8.0)

## 2023-02-24 LAB — CBC WITH AUTO DIFFERENTIAL
Basophils %: 1 % (ref 0–2)
Basophils Absolute: 0.07 10*3/uL (ref 0.00–0.20)
Eosinophils %: 1 % (ref 1–4)
Eosinophils Absolute: 0.07 10*3/uL (ref 0.00–0.44)
Hematocrit: 34.8 % — ABNORMAL LOW (ref 36.3–47.1)
Hemoglobin: 11.3 g/dL — ABNORMAL LOW (ref 11.9–15.1)
Immature Granulocytes %: 0 %
Immature Granulocytes Absolute: <0.03 10*3/uL (ref 0.00–0.30)
Lymphocytes %: 34 % (ref 24–43)
Lymphocytes Absolute: 1.68 10*3/uL (ref 1.10–3.70)
MCH: 30.4 pg (ref 25.2–33.5)
MCHC: 32.5 g/dL (ref 28.4–34.8)
MCV: 93.5 fL (ref 82.6–102.9)
MPV: 11.4 fL (ref 8.1–13.5)
Monocytes %: 7 % (ref 3–12)
Monocytes Absolute: 0.33 10*3/uL (ref 0.10–1.20)
NRBC Automated: 0 per 100 WBC
Neutrophils %: 57 % (ref 36–65)
Neutrophils Absolute: 2.84 10*3/uL (ref 1.50–8.10)
Platelets: 204 10*3/uL (ref 138–453)
RBC: 3.72 m/uL — ABNORMAL LOW (ref 3.95–5.11)
RDW: 14.1 % (ref 11.8–14.4)
WBC: 5 10*3/uL (ref 3.5–11.3)

## 2023-02-24 LAB — BASIC METABOLIC PANEL
Anion Gap: 9 mmol/L (ref 9–16)
BUN: 9 mg/dL (ref 6–20)
CO2: 22 mmol/L (ref 20–31)
Calcium: 8.7 mg/dL (ref 8.6–10.4)
Chloride: 105 mmol/L (ref 98–107)
Creatinine: 0.7 mg/dL (ref 0.50–0.90)
Est, Glom Filt Rate: >90 mL/min/{1.73_m2} (ref >60)
Glucose: 120 mg/dL — ABNORMAL HIGH (ref 74–99)
Potassium: 3.6 mmol/L — ABNORMAL LOW (ref 3.7–5.3)
Sodium: 136 mmol/L (ref 136–145)

## 2023-02-24 MED ORDER — CEPHALEXIN 500 MG PO CAPS
500 MG | ORAL_CAPSULE | Freq: Two times a day (BID) | ORAL | 0 refills | Status: DC
Start: 2023-02-24 — End: 2023-02-24

## 2023-02-24 MED ORDER — CEPHALEXIN 500 MG PO CAPS
500 MG | ORAL_CAPSULE | Freq: Two times a day (BID) | ORAL | 0 refills | Status: AC
Start: 2023-02-24 — End: 2023-03-03

## 2023-02-24 MED ORDER — CEPHALEXIN 500 MG PO CAPS
500 | ORAL_CAPSULE | Freq: Two times a day (BID) | ORAL | 0 refills | Status: DC
Start: 2023-02-24 — End: 2023-02-24

## 2023-02-24 NOTE — Discharge Instructions (Signed)
Call today or tomorrow to follow up with Heather Handler, MD  or your OB / GYN in 3-7 days    Potential miscarriage has not been ruled out in your case of pregnancy.     As discussed given the asymptomatic bacteriuria and the risks for pregnancy recommend treatment with the cephalexin please take all of these medications    Return to the Emergency Department for worsening of vaginal bleeding, using more than 4 pads per hour, feeling of weakness, dizzy, nausea / vomiting, any other care or concern.

## 2023-02-24 NOTE — ED Notes (Signed)
Pt taken to ultrasound via stretcher by transport

## 2023-02-24 NOTE — ED Notes (Signed)
The following labs were labeled with appropriate pt sticker and tubed to lab:     [x] Blue     [x] Lavender   [] on ice  [x] Green/yellow  [x] Green/black [] on ice  [] Grey  [] on ice  [] Yellow  [] Red  [] Pink  [] Type/ Screen  [] ABG  [] VBG    [] COVID-19 swab    [] Rapid  [] PCR  [] Flu swab  [] Peds Viral Panel     [x] Urine Sample  [] Fecal Sample  [] Pelvic Cultures  [] Blood Cultures  [] X 2  [] STREP Cultures  [] Wound Cultures

## 2023-02-24 NOTE — Consults (Signed)
OB/GYN Consult  [facility]    Patient Name: Heather Huerta     Patient DOB: [id number]  Room/Bed: 01/01  Admission Date/Time: 02/24/2023  9:36 AM  Primary Care Physician: Julianne Handler, MD    Consulting Provider: Dr. Laroy Apple  Reason for Consult: Pregnant Vaginal Bleeding    CC:   Chief Complaint   Patient presents with    Vaginal Bleeding     [redacted] weeks pregnant                 HPI: Heather Huerta is a 42 y.o. female [phone] presents to the ED, c/o vaginal bleeding for the last three days . Patient's last menstrual period was 01/06/2023. Patient had a pelvic ultrasound yesterday which confirmed a live IUP and also demonstrated a small subchorionic hematoma measuring 9.4 x 8.1 x 2.4 mm.     Patient reports spotting for the past three days however endorses increased vaginal bleeding this morning. Patient states she woke up noting bright red blood and has moderately saturated two pads since then. Pelvic exam was done at bedside which showed small volume clots in the vaginal vault with mild slow oozing from the cervical os.     Discussed with patient bleeding likely secondary to increasing size of subchorionic hematoma versus miscarriage. Patient consents to repeat pelvic ultrasound to evaluate fetal status and subchorionic hematoma.     Pelvic ultrasound 02/24/23: Single live intrauterine pregnancy again demonstrated with trace adjacent subchorionic hemorrhage.    REVIEW OF SYSTEMS:   Constitutional: negative fever, negative chills, negative weight changes   HEENT: negative visual disturbances, negative headaches, negative dizziness, negative hearing loss  Breast: Negative breast abnormalities, negative breast lumps, negative nipple discharge  Respiratory: negative dyspnea, negative cough, negative SOB  Cardiovascular: negative chest pain,  negative palpitations, negative arrhythmia, negative syncope   Gastrointestinal: negative abdominal pain, negative RUQ pain, positive nausea, negative vomiting, negative  diarrhea, negative constipation, negative bowel changes, negative heartburn   Genitourinary: negative dysuria, negative hematuria, negative urinary incontinence, negative vaginal discharge, positive vaginal bleeding or spotting  Dermatological: negative rash, negative pruritis, negative mole or other skin changes  Hematologic: negative bruising  Immunologic/Lymphatic: negative recent illness, negative recent sick contact  Musculoskeletal: negative back pain, negative myalgias, negative arthralgias  Neurological:  negative dizziness, negative migraines, negative seizures, negative weakness  Behavior/Psych: negative depression, negative anxiety, negative SI, negative HI    OBSTETRIC HISTORY:   OB History   Gravida Para Term Preterm AB Living   4 2 2  0 0 3   SAB IAB Ectopic Molar Multiple Live Births   0 0 0 0 0 3      # Outcome Date GA Lbr Len/2nd Weight Sex Delivery Anes PTL Lv   4 Current            3 Gravida 11/17/16    F Vag-Spont   LIV   2 Term 05/04/09    F Vag-Spont   LIV   1 Term 05/02/07    F Vag-Spont   LIV       PAST MEDICAL HISTORY:   has no past medical history on file.    PAST SURGICAL HISTORY:   has a past surgical history that includes intrauterine device insertion (N/A, 08/09/2018).    ALLERGIES:  Allergies as of 02/24/2023    (No Known Allergies)       MEDICATIONS:  No current facility-administered medications for this encounter.     Current Outpatient Medications   Medication  Sig Dispense Refill    Semaglutide,0.25 or 0.5MG /DOS, (OZEMPIC, 0.25 OR 0.5 MG/DOSE,) 2 MG/1.5ML SOPN Inject 0.5 mg into the skin once a week 1.5 mL 2    Multiple Vitamins-Minerals (WOMENS MULTI GUMMIES PO) Womens Multi      ondansetron (ZOFRAN-ODT) 4 MG disintegrating tablet Take 1 tablet by mouth every 8 hours as needed for Nausea or Vomiting (Patient not taking: Reported on 12/04/2022) 30 tablet 0    ibuprofen (ADVIL;MOTRIN) 800 MG tablet Take 1 tablet by mouth every 8 hours as needed for Pain 60 tablet 0       FAMILY  HISTORY:  Family History of Breast, Ovarian, Colon or Uterine Cancer: No   family history includes Heart Attack in her mother; High Cholesterol in her brother.    SOCIAL HISTORY:   reports that she has never smoked. She has never used smokeless tobacco. She reports that she does not currently use alcohol. She reports that she does not use drugs.  ________________________________________________________________________                                    Theodoro Kos:  Vitals:    02/24/23 0937   BP: 113/72   Pulse: 94   Resp: 17   Temp: 98.2 F (36.8 C)   SpO2: 100%                                                    INPUT/OUTPUT:  No intake/output data recorded.  No intake/output data recorded.                                                                                                                               PHYSICAL EXAM:     General appearance:  no apparent distress, alert, and cooperative  HEENT: head atraumatic, normocephalic, moist mucous membranes, trachea midline  Neurologic:  alert, oriented, normal speech, no focal findings or movement disorder noted  Lungs:  No increased work of breathing, no conversational dyspnea  Heart:  regular rate and rhythm and no murmur    Abdomen:  soft and non-tender  Extremities:  no calf tenderness, non edematous  Musculoskeletal: Gross strength equal and intact throughout, no gross abnormalities, range of motion normal in hips, knees, shoulders and spine  Psychiatric: Mood appropriate, normal affect   Rectal Exam: not indicated   Pelvic Exam:   Chaperone for Intimate Exam: Chaperone was present for entire exam, Chaperone Name: Heather movement psychotherapist, RN  External genitalia: 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  Vaginal: small volume clots noted in vaginal vault  Cervix: mild, slow, ooze noted from cervix  LAB RESULTS:  Recent Results (from the  past 24 hour(s))   Basic Metabolic Panel    Collection Time: 02/24/23 10:07 AM   Result Value Ref Range    Sodium 136 136 - 145 mmol/L    Potassium 3.6 (L) 3.7 - 5.3 mmol/L    Chloride 105 98 - 107 mmol/L    CO2 22 20 - 31 mmol/L    Anion Gap 9 9 - 16 mmol/L    Glucose 120 (H) 74 - 99 mg/dL    BUN 9 6 - 20 mg/dL    Creatinine 0.7 2.13 - 0.90 mg/dL    Est, Glom Filt Rate >90 >60 mL/min/1.42m2    Calcium 8.7 8.6 - 10.4 mg/dL   CBC with Auto Differential    Collection Time: 02/24/23 10:07 AM   Result Value Ref Range    WBC 5.0 3.5 - 11.3 k/uL    RBC 3.72 (L) 3.95 - 5.11 m/uL    Hemoglobin 11.3 (L) 11.9 - 15.1 g/dL    Hematocrit 08.6 (L) 36.3 - 47.1 %    MCV 93.5 82.6 - 102.9 fL    MCH 30.4 25.2 - 33.5 pg    MCHC 32.5 28.4 - 34.8 g/dL    RDW 57.8 46.9 - 62.9 %    Platelets 204 138 - 453 k/uL    MPV 11.4 8.1 - 13.5 fL    NRBC Automated 0.0 0.0 per 100 WBC    Neutrophils % 57 36 - 65 %    Lymphocytes % 34 24 - 43 %    Monocytes % 7 3 - 12 %    Eosinophils % 1 1 - 4 %    Basophils % 1 0 - 2 %    Immature Granulocytes % 0 0 %    Neutrophils Absolute 2.84 1.50 - 8.10 k/uL    Lymphocytes Absolute 1.68 1.10 - 3.70 k/uL    Monocytes Absolute 0.33 0.10 - 1.20 k/uL    Eosinophils Absolute 0.07 0.00 - 0.44 k/uL    Basophils Absolute 0.07 0.00 - 0.20 k/uL    Immature Granulocytes Absolute <0.03 0.00 - 0.30 k/uL   HCG, Quantitative, Pregnancy    Collection Time: 02/24/23 10:07 AM   Result Value Ref Range    hCG Quant 32,175.0 (H) 0 - 7 mIU/mL   Urinalysis with Microscopic    Collection Time: 02/24/23 10:08 AM   Result Value Ref Range    Color, UA Dark Yellow (A) Yellow    Turbidity UA Cloudy (A) Clear    Glucose, Ur NEGATIVE NEGATIVE mg/dL    Bilirubin, Urine NEGATIVE NEGATIVE    Ketones, Urine SMALL (A) NEGATIVE mg/dL    Specific Gravity, UA 1.027 1.005 - 1.030    Urine Hgb LARGE (A) NEGATIVE    pH, Urine 5.5 5.0 - 8.0    Protein, UA 1+ (A) NEGATIVE mg/dL    Urobilinogen, Urine Normal 0.0 - 1.0 EU/dL    Nitrite, Urine NEGATIVE  NEGATIVE    Leukocyte Esterase, Urine MODERATE (A) NEGATIVE    WBC, UA 50 TO 100 0 - 5 /HPF    RBC, UA 20 TO 50 0 - 4 /HPF    Casts UA  0 - 8 /LPF     5 TO 10 HYALINE Reference range defined for non-centrifuged specimen.    Epithelial Cells, UA 5 TO 10 0 - 5 /HPF    Bacteria, UA FEW (A) None        DIAGNOSTICS:  Pelvic ultrasound  02/24/23 : Single live intrauterine pregnancy again demonstrated with trace adjacent subchorionic hemorrhage.    US OB LESS THAN 14 WEEKS SINGLE OR FIRST GESTATION    Result Date: 02/23/2023  EXAMINATION: FIRST TRIMESTER OBSTETRIC ULTRASOUND 02/23/2023 TECHNIQUE: Transabdominal and transvaginal first trimester obstetric pelvic duplex ultrasound was performed with real-time imaging, color flow Doppler imaging. COMPARISON: None HISTORY: ORDERING SYSTEM PROVIDED HISTORY: Early stage of pregnancy TECHNOLOGIST PROVIDED HISTORY: This procedure can be scheduled via MyChart. FINDINGS: Uterus: Retroverted; 9.2 x 5.1 x 5.2 cm Gestational Sac(s):  Single normal appearing gestational sac.  There appears to be a tiny subchorionic hematoma measuring approximately 9.4 x 8.1 x 2.4 mm. Yolk Sac:  Present Fetal Pole:  Single fetal pole Crown Rump Length:  7.38 mm Fetal Heart Rate:  131 beats per minute Right ovary: 0.4 x 2.1 x 2.7 cm; hypoechoic/complex cystic area measuring up to 2.1 x 2 x 1.7 cm, possibly a corpus luteum Left ovary: 1.4 x 2.9 x 1.7 cm; prominent left adnexal vessels. Free fluid: No significant free fluid. Measurements: Estimated gestational age by current ultrasound: 6 weeks, 4 days Estimated gestational age by LMP/prior ultrasound: 6 weeks, 6 days Estimated Due Date: 10/15/2023     Single live intrauterine pregnancy with an estimated gestational age of [redacted] weeks, 4 days. Tiny subchorionic hematoma.  Probable small corpus luteum right ovary.     US OB TRANSVAGINAL    Result Date: 02/23/2023  EXAMINATION: FIRST TRIMESTER OBSTETRIC ULTRASOUND 02/23/2023 TECHNIQUE: Transabdominal and transvaginal first  trimester obstetric pelvic duplex ultrasound was performed with real-time imaging, color flow Doppler imaging. COMPARISON: None HISTORY: ORDERING SYSTEM PROVIDED HISTORY: Early stage of pregnancy TECHNOLOGIST PROVIDED HISTORY: This procedure can be scheduled via MyChart. FINDINGS: Uterus: Retroverted; 9.2 x 5.1 x 5.2 cm Gestational Sac(s):  Single normal appearing gestational sac.  There appears to be a tiny subchorionic hematoma measuring approximately 9.4 x 8.1 x 2.4 mm. Yolk Sac:  Present Fetal Pole:  Single fetal pole Crown Rump Length:  7.38 mm Fetal Heart Rate:  131 beats per minute Right ovary: 0.4 x 2.1 x 2.7 cm; hypoechoic/complex cystic area measuring up to 2.1 x 2 x 1.7 cm, possibly a corpus luteum Left ovary: 1.4 x 2.9 x 1.7 cm; prominent left adnexal vessels. Free fluid: No significant free fluid. Measurements: Estimated gestational age by current ultrasound: 6 weeks, 4 days Estimated gestational age by LMP/prior ultrasound: 6 weeks, 6 days Estimated Due Date: 10/15/2023     Single live intrauterine pregnancy with an estimated gestational age of [redacted] weeks, 4 days. Tiny subchorionic hematoma.  Probable small corpus luteum right ovary.       ASSESSMENT & PLAN:    Mirka Upright is a 42 y.o. female [phone] @ [redacted]w[redacted]d who presents with vaginal bleeding   - VSS, afebrile   - Labs:     - BMP unremarkable    - Hgb 11.3   - Imaging:     - Pelvic US 7/2: Single live IUP with small subchorionic hematoma measuring 9.4 x 8.1 x 2.4 mm    - Pelvic US 7/3: Single live intrauterine pregnancy again demonstrated with trace adjacent subchorionic hemorrhage.   - Patient initially presented due to concern for increased vaginal bleeding and possible miscarriage    - Physical exam shows small volume clots in vaginal vault with mild ooze from cervix.   - Repeat pelvic US shows live IUP with minimal trace of subchorionic hematoma.   - Discussed findings with patient with high clinical suspicion of  vaginal bleeding being 2/2 passage of  subchorionic hematoma. Patient relieved and reassurance given.    - Patient stable for discharge home from an OBGYN perspective   - Encourage close follow up with primary OBGYN   - Discussed bleeding precautions and low threshold to return to the ED for further evaluation and management.     Patient Active Problem List    Diagnosis Date Noted    Missed ab 06/27/2021     Priority: Medium    Nasal polyp 10/17/2022       Plan discussed with Dr. Helene Shoe, who is agreeable.     Attending's Name: Dr. Gerrianne Scale, MD  Ob/Gyn Resident  [facility]  02/24/2023, 11:52 AM    Resident Physician Statement  I have discussed the case, including pertinent history and exam findings with the above. I have personally seen the patient. I agree with the assessment, plan and orders as documented. I have made changes to the above note as needed. I have discussed the case with above named attending.      Patient is in agreement with plan. All questions answered.     Carlis Abbott, MD  OBGYN Resident, PGY3  [facility], [place]  02/24/2023 12:04 PM

## 2023-02-24 NOTE — ED Notes (Signed)
OB at bedside and preformed pelvic exam

## 2023-02-24 NOTE — ED Triage Notes (Signed)
Pt arrived to ED 01 via triage.   Pt co vaginal bleeding.   Pt states that she is [redacted] weeks pregnant.   Pt states that the spotting started 3 days ago but is now heavier and passing clots.   Pt denies any abdominal cramping.   Pt denies any CP or SOB .  Pt is resting on stretcher with call light within reach.  Breathing is non labored and no acute distress is noted.   Will continue to follow plan of care

## 2023-02-24 NOTE — ED Provider Notes (Signed)
[facility] ED  eMERGENCY dEPARTMENT eNCOUnter    Pt Name: Heather Huerta  MRN: [id number]  Birthdate December 13, 1980  Date of evaluation: 02/24/2023  Note Started: 9:52 AM EDT  PCP:  Julianne Handler, MD    CHIEF COMPLAINT       Chief Complaint   Patient presents with    Vaginal Bleeding     [redacted] weeks pregnant          HISTORY OF PRESENT ILLNESS    Heather Huerta is a 42 y.o. female who presents with vaginal bleeding patient is a G5, P3 with a prior missed abortion.  Patient was seen yesterday for evaluation and ultrasound showed a intrauterine pregnancy at 6 weeks 4 days with a tiny subchorionic hemorrhage per chart review and patient and family report.  Patient noted vaginal bleeding became much worse today gush of blood occurred and patient has been going through 2 pads so far this morning.  Having some cramping and some nausea but currently declining or any need for medication at this time no fevers no chills no pain discomfort burning with urination.  Per chart review patient is be positive based on testing from yesterday hCG quantitative was over 18,000      PHYSICAL EXAM     INITIAL VITALS:  weight is 99 kg (218 lb 4.1 oz). Her oral temperature is 98.2 F (36.8 C). Her blood pressure is 113/72 and her pulse is 94. Her respiration is 17 and oxygen saturation is 100%.      Physical Exam  Constitutional:       Appearance: She is well-developed. She is not diaphoretic.   HENT:      Head: Normocephalic and atraumatic.      Right Ear: External ear normal.      Left Ear: External ear normal.   Eyes:      General: No scleral icterus.        Right eye: No discharge.         Left eye: No discharge.   Neck:      Trachea: No tracheal deviation.   Pulmonary:      Effort: Pulmonary effort is normal. No respiratory distress.      Breath sounds: No stridor.   Musculoskeletal:         General: Normal range of motion.      Cervical back: Normal range of motion.   Skin:     General: Skin is warm and dry.   Neurological:       Mental Status: She is alert and oriented to person, place, and time.      Coordination: Coordination normal.   Psychiatric:         Behavior: Behavior normal.           DIFFERENTIAL DIAGNOSIS/MDM:       Medical Decision Making  Problems Addressed:  Asymptomatic bacteriuria during pregnancy: acute illness or injury  Vaginal bleeding in pregnancy: acute illness or injury    Amount and/or Complexity of Data Reviewed  External Data Reviewed: notes.  Labs: ordered. Decision-making details documented in ED Course.  Radiology: ordered. Decision-making details documented in ED Course.    Risk  Prescription drug management.    With the complaint of vaginal bleeding in pregancy    The patient has no signs of ectopic pregnancy no signs of placental abruption or previa given the early nature of the pregnancy patient is not having severe hemorrhage and H&H is stable patient is having some  asymptomatic bacteriuria that we will need to be treated for reducing the risk of miscarriage currently OB evaluation showed that the cervical os is closed ultrasound shows continued single live intrauterine pregnancy and OB has cleared the patient for discharge as well      ED Course as of 02/24/23 1205   Wed Feb 24, 2023   1040 Signs consistent with a urinary tract infection though asymptomatic pt is pregnant and will need medications.   [WK]   1041 H and H stable, No severe anemia. The pt has no WBC elevation or platelet elevation.   [WK]   1131 Near doubling of hCG [WK]   1131 Very borderline K+ will advise increasing intake orally with foods high in vitamin K+ [WK]   1132 IMPRESSION:  Single live intrauterine pregnancy again demonstrated with trace adjacent  subchorionic hemorrhage.      [WK]      ED Course User Index  [WK] Vickie Epley, DO            Vitals:    Vitals:    02/24/23 0937   BP: 113/72   Pulse: 94   Resp: 17   Temp: 98.2 F (36.8 C)   TempSrc: Oral   SpO2: 100%   Weight: 99 kg (218 lb 4.1 oz)     CONSULTS:  IP CONSULT TO  OB GYN        FINAL IMPRESSION      1. Vaginal bleeding in pregnancy    2. Asymptomatic bacteriuria during pregnancy            DISPOSITION/PLAN   DISPOSITION Decision To Discharge 02/24/2023 11:53:34 AM        PATIENT REFERRED TO:  Nicholes Rough, APRN - CNP  [address] [place] [address]  [phone]    Call today  For a follow up in the next week    Keep the follow up plans recommended by the [facility] team.          [facility] ED  [address]  [place] [address]  [phone]  Go to   Should you have any change in condition such as increased bleeding, shortness of breath or abdominal pain return to the ER immediatly.      DISCHARGE MEDICATIONS:  New Prescriptions    CEPHALEXIN (KEFLEX) 500 MG CAPSULE    Take 1 capsule by mouth 2 times daily for 7 days       (Please note that portions of this note were completed with a voice recognition program.  Efforts were made to edit the dictations but occasionally words are mis-transcribed.)    Vickie Epley, DO  Emergency Medicine Attending         Vickie Epley, DO  02/24/23 1205

## 2023-02-24 NOTE — ED Notes (Signed)
Pt back from ultrasound via stretcher.

## 2023-02-24 NOTE — Telephone Encounter (Signed)
Pt calling into office regarding vaginal bleeding in early pregnancy. Pt states it started as spotting yesterday then was heavier this morning. PT denies any fever, abdominal cramping, or headache. PT instructed to go to ED to be evaluated.

## 2023-02-26 LAB — C.TRACHOMATIS N.GONORRHOEAE DNA, URINE
C. trachomatis DNA ,Urine: NEGATIVE
N. gonorrhoeae DNA, Urine: NEGATIVE

## 2023-02-26 NOTE — Care Coordination-Inpatient (Signed)
ACM attempted to reach patient for Maternity Care Management/ Care transitions call. HIPAA compliant message left requesting a return phone call at patients convenience. Will continue to follow.

## 2023-03-01 NOTE — Telephone Encounter (Signed)
Left message for patient to return phone call.  

## 2023-03-01 NOTE — Telephone Encounter (Signed)
-----   Message from Nicholes Rough, APRN - CNP sent at 02/26/2023  7:57 AM EDT -----  IUP at 6 weeks and 4 days by ultrasound  Please schedule for new OB intake/ ultrasound  Subchorionic bleed noted. Pelvic rest and no heavy lifting  Prenatal vitamin 1 pO qD with 12 refills.

## 2023-03-04 ENCOUNTER — Telehealth

## 2023-03-04 NOTE — Telephone Encounter (Signed)
-----   Message from Nicholes Rough, APRN - CNP sent at 02/26/2023  7:57 AM EDT -----  IUP at 6 weeks and 4 days by ultrasound  Please schedule for new OB intake/ ultrasound  Subchorionic bleed noted. Pelvic rest and no heavy lifting  Prenatal vitamin 1 pO qD with 12 refills.

## 2023-03-04 NOTE — Telephone Encounter (Signed)
Patient notified of subchorionic bleed found on ultrasound and given pelvic rest recommendations and lifting restrictions of nothing over 15 pounds. Patient voiced understanding. Patient has intake and sono scheduled 03/22/23.

## 2023-03-18 ENCOUNTER — Encounter

## 2023-03-18 ENCOUNTER — Encounter
Admit: 2023-03-18 | Discharge: 2023-03-18 | Payer: PRIVATE HEALTH INSURANCE | Attending: Women's Health | Primary: Student in an Organized Health Care Education/Training Program

## 2023-03-18 ENCOUNTER — Ambulatory Visit
Admit: 2023-03-18 | Discharge: 2023-03-18 | Payer: PRIVATE HEALTH INSURANCE | Primary: Student in an Organized Health Care Education/Training Program

## 2023-03-18 DIAGNOSIS — O208 Other hemorrhage in early pregnancy: Secondary | ICD-10-CM

## 2023-03-18 DIAGNOSIS — Z3490 Encounter for supervision of normal pregnancy, unspecified, unspecified trimester: Secondary | ICD-10-CM

## 2023-03-18 MED ORDER — PRENATAL VITAMIN 27-0.8 MG PO TABS
27-0.8 | ORAL_TABLET | Freq: Every day | ORAL | 11 refills | Status: DC
Start: 2023-03-18 — End: 2024-03-14

## 2023-03-18 MED ORDER — PYRIDOXINE HCL 25 MG PO TABS
25 | ORAL_TABLET | Freq: Every day | ORAL | 6 refills | Status: DC
Start: 2023-03-18 — End: 2023-04-02

## 2023-03-18 NOTE — Progress Notes (Signed)
Patient presents to office for OB intake, nurse visit only. Intake completed following ultrasound in office to confirm pregnancy dating/viability. Based on ultrasound, patient is currently [redacted]w[redacted]d  gestation, Estimated Date of Delivery: 10/15/23. Patient presents to intake Alone. This was an planned pregnancy. Patient currently taking has a current medication list which includes the following prescription(s): prenatal vitamin and pyridoxine.. Patient's medical, surgical, obstetrical and family history reviewed. See HER for details. Patient prenatal lab work given to patient at this visit as well as OB Secretary/administrator. New OB consent forms signed. Patient accepted first trimester screening. Cystic Fibrosis screening accepted, order in EPIC . Referral placed to MFM for first trimester screen. Patient scheduled for follow up OB appointment with Carollee Herter, CNP on 04/02/23. Patient instructed to complete lab work prior to follow up OB appointment.

## 2023-03-19 ENCOUNTER — Encounter

## 2023-03-19 ENCOUNTER — Inpatient Hospital Stay: Payer: PRIVATE HEALTH INSURANCE | Primary: Student in an Organized Health Care Education/Training Program

## 2023-03-19 LAB — URINALYSIS WITH REFLEX TO CULTURE
Bilirubin, Urine: NEGATIVE
Glucose, Ur: NEGATIVE mg/dL
Ketones, Urine: NEGATIVE mg/dL
Nitrite, Urine: NEGATIVE
Protein, UA: NEGATIVE mg/dL
Specific Gravity, UA: 1.021 (ref 1.005–1.030)
Urine Hgb: NEGATIVE
Urobilinogen, Urine: NORMAL EU/dL (ref 0.0–1.0)
pH, Urine: 7 (ref 5.0–8.0)

## 2023-03-19 LAB — MICROSCOPIC URINALYSIS
Casts UA: 0 /LPF (ref 0–8)
Epithelial Cells, UA: 2 /HPF (ref 0–5)
RBC, UA: 2 /HPF (ref 0–4)
WBC, UA: 2 /HPF (ref 0–5)

## 2023-03-22 ENCOUNTER — Encounter

## 2023-03-22 NOTE — Telephone Encounter (Signed)
-----   Message from Nicholes Rough, APRN - CNP sent at 03/19/2023  7:50 AM EDT -----  IUP at 9 weeks and 6 days  Please refer to MFM for first trimester screen  Prenatal vitamin 1 PO QD with 12 refills.  Subchorionic hemorrhage noted.  Pelvic rest and no heavy lifting.

## 2023-03-22 NOTE — Telephone Encounter (Signed)
Patient notified of results, patient previously notified of subchorionic hemorrhage. Consult sent to MFM for AMA and history of miscarriage. Currently taking prenatal vitamins

## 2023-03-25 NOTE — Care Coordination-Inpatient (Signed)
Placed f/u call to the pt she was on another call. This cm to try pt again after hours.

## 2023-04-02 ENCOUNTER — Encounter
Admit: 2023-04-02 | Discharge: 2023-04-02 | Payer: BLUE CROSS/BLUE SHIELD | Attending: Women's Health | Primary: Student in an Organized Health Care Education/Training Program

## 2023-04-02 ENCOUNTER — Inpatient Hospital Stay: Payer: PRIVATE HEALTH INSURANCE | Primary: Student in an Organized Health Care Education/Training Program

## 2023-04-02 DIAGNOSIS — Z3A12 12 weeks gestation of pregnancy: Secondary | ICD-10-CM

## 2023-04-02 DIAGNOSIS — O09529 Supervision of elderly multigravida, unspecified trimester: Secondary | ICD-10-CM

## 2023-04-02 NOTE — Progress Notes (Signed)
Heather Huerta  04/02/2023    Date Of Birth:  Jul 30, 1981    04/02/2023   Patient's last menstrual period was 01/06/2023.  [redacted]w[redacted]d  [id number]      Primary Care Physician: Julianne Handler, MD        CC: Initial Prenatal Visit    Subjective:     Heather Huerta is a 42 y.o. female [id number]    is being seen today for her first obstetrical visit.  This is a planned pregnancy. She is at [redacted]w[redacted]d  Her obstetrical history is significant for advanced maternal age.      Relationship with FOB: spouse, living together. Patient does intend to breast feed. Pregnancy history fully reviewed.  Mother's ethnicity: African American  Father's ethnicity: African American      Objective:   Blood pressure 120/78, pulse 86, weight 98 kg (216 lb), last menstrual period 01/06/2023, not currently breastfeeding.    OB History   Gravida Para Term Preterm AB Living   5 3 3  0 1 3   SAB IAB Ectopic Molar Multiple Live Births   1 0 0 0 0 3      # Outcome Date GA Lbr Len/2nd Weight Sex Delivery Anes PTL Lv   5 Current            4 SAB 05/2021           3 Term 11/17/16    F Vag-Spont   LIV   2 Term 05/04/09    F Vag-Spont   LIV   1 Term 05/02/07    F Vag-Spont   LIV       No past medical history on file.  Past Surgical History:   Procedure Laterality Date    INTRAUTERINE DEVICE INSERTION N/A 08/09/2018    MIrena insertion NDC [id number]      Social History     Socioeconomic History    Marital status: Unknown     Spouse name: Not on file    Number of children: Not on file    Years of education: Not on file    Highest education level: Not on file   Occupational History    Not on file   Tobacco Use    Smoking status: Never    Smokeless tobacco: Never   Vaping Use    Vaping Use: Never used   Substance and Sexual Activity    Alcohol use: Not Currently    Drug use: Never    Sexual activity: Yes     Partners: Male   Other Topics Concern    Not on file   Social History Narrative    Not on file     Social Determinants of Health     Financial  Resource Strain: Low Risk  (10/16/2022)    Overall Financial Resource Strain (CARDIA)     Difficulty of Paying Living Expenses: Not hard at all   Food Insecurity: No Food Insecurity (10/16/2022)    Hunger Vital Sign     Worried About Running Out of Food in the Last Year: Never true     Ran Out of Food in the Last Year: Never true   Transportation Needs: Unknown (10/16/2022)    PRAPARE - Therapist, art (Medical): Not on file     Lack of Transportation (Non-Medical): No   Physical Activity: Not on file   Stress: Not on file   Social Connections: Not on file   Intimate Partner Violence: Not on file  Housing Stability: Unknown (10/16/2022)    Housing Stability Vital Sign     Unable to Pay for Housing in the Last Year: Not on file     Number of Places Lived in the Last Year: Not on file     Unstable Housing in the Last Year: No     Family History   Problem Relation Age of Onset    Heart Attack Mother     High Cholesterol Brother        MEDICATIONS:  Current Outpatient Medications   Medication Sig Dispense Refill    Prenatal Vit-Fe Fumarate-FA (PRENATAL VITAMIN) 27-0.8 MG TABS Take 1 tablet by mouth daily 30 tablet 11     No current facility-administered medications for this visit.           ALLERGIES:  Allergies as of 04/02/2023    (No Known Allergies)           Physical Exam Completed-See Epic Navigator    Chaperone for Intimate Exam  Chaperone was offered and accepted as part of the rooming process.  Chaperone: amber MA               Assessment:      Pregnancy at 12 and 0/7 weeks    Diagnosis Orders   1. Antepartum multigravida of advanced maternal age        2. [redacted] weeks gestation of pregnancy  PAP SMEAR    Culture, Genital    C.trachomatis N.gonorrhoeae DNA              Patient Active Problem List    Diagnosis Date Noted    Missed ab 06/27/2021     Priority: Medium    Antepartum multigravida of advanced maternal age 55/04/2023     Advanced Maternal Age Counseling    If a woman is over the age  of 29, she is considered to be of Advanced Maternal Age, and with this title comes risks for mom and baby.   Increased risk of Down Syndrome - the most common chromosomal birth defect (chromosomes - cells that carry genes and transmit heredity information)   Increased risk of miscarriage   20% increase at ages 13 to 58   35% increase at ages 67-44   Over 50% increase by age 2  Increased risk of Cesarean Section (C-Section) for delivery method   Gestational diabetes - diabetes that develops for the first time during pregnancy. Women who have this could possibly have a very large baby who then is at risk for injuries during delivery.   Pregnancy Induced Hypertension - High blood pressure   Placental Problems - one of the most common placental problems is placenta previa in which the placenta covers all or part of the uterine opening (cervix). This can cause severe bleeding during delivery and can make C-section delivery necessary.  Even with all of these increased risks, with the advancement in medical procedures and testing, there are several ways to reduce your risk. They include early and regular prenatal care, taking the multivitamin with folic acid prescribed by your health care manager, beginning pregnancy at a healthy weight, not smoking or drinking alcoholic beverages, and eating healthy foods.   There are tests that all pregnant women are encouraged to take, but there is additional prenatal testing that is regularly offered to any woman 41 or older because of the potential of increased risks to the mother and baby. These tests are chorionic villus sampling (CVS) and amniocentesis. NIPT is also  available which is a non-invasive option for fetal karyotyping.  With CVS, a small sample of cells (called chorionic villi) is taken from the placenta where it attached to the wall of the uterus. Chorionic villi are tiny parts of the placenta; therefore they have the same genes as the baby. This test is 98% accurate,  but can carry a slightly higher risk of miscarriage than amniocentesis, since the procedure is done in early pregnancy.   Amniocentesis is a procedure where a sample of fluid is removed from the amniotic sac for analysis and evaluation. During this procedure, fluid is removed by placing a long needle through the abdominal wall into the amniotic sac. The amniocentesis needle is typically guided into the sac with the help of ultrasound imaging. Once the needle is in the sac, a syringe is used to withdraw the clear amber-colored amniotic fluid, which looks a bit like urine.  NIPT, utilizes the maternal blood to test for fetal cells. These fetal cells are then karyotyped for genetic evaluation.  All of these tests, CVS, NIPT and amniocentesis, let couples know if the fetus will have genetic abnormalities, and will help them make informed decisions regarding their pregnancy. Karyotyping by either CVS, NIPT or Amniocentesis is the ONLY way to confirm the genetic chromosomal structure regarding trisomy; Down's Syndrome, Edward's or Pateau's. TOP ST OH was reviewed. If NIPT is positive then a confirmatory amniocentesis would be recommended to confirm the diagnosis.  TOPSTOH guidelines were reviewed.        Asymptomatic bacteriuria during pregnancy 02/24/2023    Vaginal bleeding in pregnancy 02/24/2023    Nasal polyp 10/17/2022                    Plan:      Initial lab slip given   Prenatal vitamins.  Problem list reviewed and updated.  NT Screen/Quad Testing and MSAFP Single Marker: requested  Role of ultrasound in pregnancy discussed; fetal survey: requests  Amniocentesis discussed: Not indicated  NIPT Testing indicated for AMA  Cultures & Wet Prep Collected  Follow-up in 4 weeks  Repeat Annual every 1 year  Cervical Cytology Evaluation begins at 43 years old.  If Negative Cytology, Follow-up screening per current guidelines.   MFM appointment scheduled      COUNSELING COMPLETED:  INITIAL OBSTETRICAL VISIT EVALUATION:  The  patient was seen full history and physical was completed/reviewed. Cytology was collected for patients over 14 years of age.Cultures were collected. The patient was counseled on office policies and she was counseled on termination of pregnancy in the state of [place].     The patient was counseled on Toxoplasmosis, HIV, Tobacco Abuse, Group Beta Strep Infections, Cystic Fibrosis, Preterm Labor precautions and Sickle Cell disease.    The patient was counseled on the risks of tobacco abuse. Both maternal and fetal. She was instructed to stop smoking if currently using tobacco. Morbidity, mortality, and cessation programs were reviewed. The risks include but are not limited to increased risks of preterm labor, preterm delivery, premature rupture of membranes, intrauterine growth restriction, intrauterine fetal demise and abruptio placenta. Secondary smoke risks were also reviewed. Increases in cancer, respiratory problems, and sudden infant death syndrome were reviewed as well.    The patient was informed of a 2-4% risk of congenital anomalies in the general population. She was also informed that karyotyping is the only way to evaluate the fetus for genetic problems and genetic lethal anomalies. Chorionic villous sampling, amniocentesis and NIPT testing were also  discussed with morbidity rates in detail. She requested the procedure.    Route of delivery and counseling on vaginal, operative vaginal, and cesarean sections were completed with the risks of each to both the patient as well as her baby. The possibility of a blood transfusion was discussed as well. The patient was not opposed to receiving a transfusion if needed.    Nuchal translucency/Quad Evaluation and MSAFP single marker testing was reviewed in detail with attention to timing of testing and their windows. For patients beyond the gestational age for Nuchal translucency evaluation Quad testing was recommended. Timing for the Quad test was reviewed. Benefits of  the above testing was reviewed. A second trimester amniocentesis was also made available to the patient. Risks, Benefits and non-invasive alternative testing was reviewed.     The literature regarding a questionable link to pitocin augmentation and induction of labor, the assistance of labor contractions and the initiation of contractions to help delivery, have been reviewed with the patient regarding the increased potential of having a newborn with Attention Deficit Hyperactivity Disorder and or Autism. These two disorders and the ramifications of their impact on a child and the family caring for that child has been reviewed with the patient in detail. She was given the risks, benefits and alternatives of the use of this medication. She has agreed to its use in the delivery of her unborn child if needed at the time of delivery, Yes.    The patient was questioned in detail regarding any genetic misnomer history, chromosomal abnormalities, or learning disabilities in  herself, the father of the baby or their families. SHE DENIED ANY HISTORY AS STATED ABOVE: Yes    Upon completion of the visit all questions were answered and the patients follow-up and testing schedule were reviewed.  Prenatal vitamins were given.    T-dap Vaccine recommendations reviewed with the patient. Patient notified of timing of vaccination 27-[redacted] weeks gestation. Patient aware Vaccine is NOT Live. Yes.            The patient, Heather Huerta is a 42 y.o. female, was seen with a total time spent of 30 minutes for the visit on this date of service by the E/M provider. The time component had both face to face and non face to face time spent in determining the total time component.  Counseling and education regarding her diagnosis listed below and her options regarding those diagnoses were also included in determining her time component.      Diagnosis Orders   1. Antepartum multigravida of advanced maternal age        2. [redacted] weeks gestation of  pregnancy  PAP SMEAR    Culture, Genital    C.trachomatis N.gonorrhoeae DNA           The patient had her preventative health maintenance recommendations and follow-up reviewed with her at the completion of her visit.

## 2023-04-05 LAB — CULTURE, GENITAL
Culture: NEGATIVE
Culture: NORMAL

## 2023-04-05 LAB — C.TRACHOMATIS N.GONORRHOEAE DNA
C. trachomatis DNA: NEGATIVE
N. gonorrhoeae DNA: NEGATIVE

## 2023-04-05 NOTE — Telephone Encounter (Signed)
-----   Message from [facility], APRN - CNP sent at 04/05/2023  8:04 AM EDT -----  +GBS- treat in labor per protocol  +candida- OTC monistat if patient is having symptoms in early pregnancy.  GC/ chlamydia pending

## 2023-04-05 NOTE — Telephone Encounter (Signed)
[facility] OB/GYN Result Note Patient Contact Communication   [address] [place], [place] [address]      04/05/23   4:20 PM     Patients Name: Heather Huerta   Medical Record Number: [id number]   Contact Serial Number: [id number]  Date of Birth: 1981-01-29       Patients Phone Number: [phone]     Description of Recommendations:  The patient was contacted and her identity was confirmed with two of the specific identifiers listed above.  The information regarding her recent testing results and provider recommendations were reviewed with her in detail and all questions were answered.   Recent labs/Cultures/ Imaging Studies/Pathology reports and Urinalysis results are included:      Recommendations given by provider:  Beam, Zenaida Deed, APRN - CNP  P [facility] Clinical Support Pool  +GBS- treat in labor per protocol  +candida- OTC monistat if patient is having symptoms in early pregnancy.  GC/ chlamydia pending    The patient verbalized understanding of the information above and the recommendation by the provider. She will contact her PCP if any other questions arise or contact our office for any other clarifications.        Electronically signed by Wynonia Hazard on 04/05/2023 at 4:20 PM

## 2023-04-07 ENCOUNTER — Ambulatory Visit
Admit: 2023-04-07 | Discharge: 2023-04-07 | Payer: PRIVATE HEALTH INSURANCE | Attending: Maternal & Fetal Medicine | Primary: Student in an Organized Health Care Education/Training Program

## 2023-04-07 DIAGNOSIS — Z369 Encounter for antenatal screening, unspecified: Secondary | ICD-10-CM

## 2023-04-07 DIAGNOSIS — O09521 Supervision of elderly multigravida, first trimester: Secondary | ICD-10-CM

## 2023-04-07 NOTE — Progress Notes (Signed)
Please refer to attached ultrasound report for doctor's evaluation of the clinical information obtained by vital signs, ultrasound, and/or non-stress test along with management recommendation.

## 2023-04-07 NOTE — Patient Instructions (Signed)
Pt advised to follow up with referring provider.

## 2023-04-08 ENCOUNTER — Inpatient Hospital Stay: Payer: PRIVATE HEALTH INSURANCE | Primary: Student in an Organized Health Care Education/Training Program

## 2023-04-08 DIAGNOSIS — Z369 Encounter for antenatal screening, unspecified: Secondary | ICD-10-CM

## 2023-04-08 DIAGNOSIS — Z3490 Encounter for supervision of normal pregnancy, unspecified, unspecified trimester: Secondary | ICD-10-CM

## 2023-04-08 LAB — CBC WITH AUTO DIFFERENTIAL
Basophils %: 1 % (ref 0–2)
Basophils Absolute: 0.03 10*3/uL (ref 0.00–0.20)
Eosinophils %: 1 % (ref 1–4)
Eosinophils Absolute: 0.05 10*3/uL (ref 0.00–0.44)
Hematocrit: 35.3 % — ABNORMAL LOW (ref 36.3–47.1)
Hemoglobin: 11.6 g/dL — ABNORMAL LOW (ref 11.9–15.1)
Immature Granulocytes %: 0 %
Immature Granulocytes Absolute: <0.03 10*3/uL (ref 0.00–0.30)
Lymphocytes %: 28 % (ref 24–43)
Lymphocytes Absolute: 1.48 10*3/uL (ref 1.10–3.70)
MCH: 30.4 pg (ref 25.2–33.5)
MCHC: 32.9 g/dL (ref 28.4–34.8)
MCV: 92.7 fL (ref 82.6–102.9)
MPV: 11.8 fL (ref 8.1–13.5)
Monocytes %: 5 % (ref 3–12)
Monocytes Absolute: 0.27 10*3/uL (ref 0.10–1.20)
NRBC Automated: 0 /100{WBCs}
Neutrophils %: 65 % (ref 36–65)
Neutrophils Absolute: 3.54 10*3/uL (ref 1.50–8.10)
Platelets: 204 10*3/uL (ref 138–453)
RBC: 3.81 m/uL — ABNORMAL LOW (ref 3.95–5.11)
RDW: 13.7 % (ref 11.8–14.4)
WBC: 5.4 10*3/uL (ref 3.5–11.3)

## 2023-04-08 LAB — LIPID PANEL
Chol/HDL Ratio: 2
Cholesterol, Total: 162 mg/dL (ref 0–199)
HDL: 67 mg/dL (ref >40)
LDL Cholesterol: 82 mg/dL (ref 0–100)
Triglycerides: 67 mg/dL (ref <150)
VLDL: 13 mg/dL

## 2023-04-08 LAB — HEPATITIS C ANTIBODY: Hepatitis C Ab: NONREACTIVE

## 2023-04-08 LAB — HIV SCREEN: HIV Ag/Ab: NONREACTIVE

## 2023-04-08 LAB — TSH REFLEX TO FT4: TSH: 2.36 u[IU]/mL (ref 0.27–4.20)

## 2023-04-08 LAB — PRENATAL TYPE AND SCREEN
ABO/Rh: B POS
Antibody Screen: NEGATIVE

## 2023-04-08 LAB — HEMOGLOBIN A1C
Estimated Avg Glucose: 111 mg/dL
Hemoglobin A1C: 5.5 % (ref 4.0–6.0)

## 2023-04-08 LAB — HEPATITIS B SURFACE ANTIGEN OBSTETRIC PANEL: Hepatitis B Surface Ag: NONREACTIVE

## 2023-04-08 LAB — T. PALLIDUM AB: T. pallidum, IgG: NONREACTIVE

## 2023-04-08 LAB — GLUCOSE, RANDOM: Glucose: 106 mg/dL — ABNORMAL HIGH (ref 74–99)

## 2023-04-08 LAB — RUBELLA ANTIBODY, IGG: Rubella Antibody, IgG: 24.2 [IU]/mL

## 2023-04-09 MED ORDER — ASPIRIN 81 MG PO CHEW
81 MG | ORAL_TABLET | Freq: Every day | ORAL | 8 refills | Status: DC
Start: 2023-04-09 — End: 2023-10-07

## 2023-04-09 NOTE — Telephone Encounter (Signed)
[facility] OB/GYN Result Note Patient Contact Communication   [address] [place], [place] [address]      04/09/23   9:48 AM     Patients Name: Heather Huerta   Medical Record Number: [id number]   Contact Serial Number: [id number]  Date of Birth: 1980-12-02       Patients Phone Number: [phone]     Description of Recommendations:  The patient was contacted and her identity was confirmed with two of the specific identifiers listed above.  The information regarding her recent testing results and provider recommendations were reviewed with her in detail and all questions were answered.   Recent labs/Cultures/ Imaging Studies/Pathology reports and Urinalysis results are included:      Recommendations given by provider:  Per Joni Reining pt to begin taking baby aspirin 81 mg daily per Pt MFM report from 04/07/23.    The patient verbalized understanding of the information above and the recommendation by the provider. She will contact her PCP if any other questions arise or contact our office for any other clarifications.        Electronically signed by Rowe Clack, MA on 04/09/2023 at 9:48 AM

## 2023-04-10 LAB — MATERNAL SERUM SCREEN
AFP Interp: NEGATIVE
CRL: 67.4 mm
Crown Rump Length: 67.4
Date of Birth: [birth year]
Gestational Age: 12
MS HCG MoM: 0.82
MS HCG: 55747 IU/L
Maternal Age At EDD: 44 a
Maternal Weight: 216
MoM for NT: 1.33
MoM for PAPP-A: 1.43
Nuchal Transluc (NT): 2.2
Nuchal Transluc (NT): 2.2 mm
Number of Fetuses: 1
PAPP-A MoM: 1299.1 ng/mL
Patient Weight: 216
Reading MD Cert Num: 28319
Sonographer Cert No: 275240
ULTRASOUND DATE: 8152024

## 2023-04-16 LAB — CYSTIC FIBROSIS CARRIER STUDY
Cystic Fibrosis Allele 1: NEGATIVE
Cystic Fibrosis Allele 2: NEGATIVE
Cystic Fibrosis Panel Interpretation: 0

## 2023-04-20 NOTE — Care Coordination-Inpatient (Signed)
 Opened in error

## 2023-04-30 ENCOUNTER — Encounter
Admit: 2023-04-30 | Discharge: 2023-04-30 | Payer: PRIVATE HEALTH INSURANCE | Attending: Family | Primary: Student in an Organized Health Care Education/Training Program

## 2023-04-30 ENCOUNTER — Encounter
Payer: PRIVATE HEALTH INSURANCE | Attending: Obstetrics & Gynecology | Primary: Student in an Organized Health Care Education/Training Program

## 2023-04-30 VITALS — BP 122/76 | HR 98 | Wt 216.0 lb

## 2023-04-30 DIAGNOSIS — O09529 Supervision of elderly multigravida, unspecified trimester: Secondary | ICD-10-CM

## 2023-04-30 MED ORDER — FAMOTIDINE 20 MG PO TABS
20 | ORAL_TABLET | Freq: Two times a day (BID) | ORAL | 3 refills | Status: DC
Start: 2023-04-30 — End: 2023-11-25

## 2023-04-30 NOTE — Progress Notes (Signed)
 Heather Huerta is a 42 y.o. female [redacted]w[redacted]d    [id number]    OB History   Gravida Para Term Preterm AB Living   5 3 3   1 3    SAB IAB Ectopic Molar Multiple Live Births   1         3      # Outcome Date GA Lbr Len/2nd Weight Sex Type Anes PTL Lv   5 Current            4 SAB 05/2021           3 Term 11/17/16    F Vag-Spont   LIV   2 Term 05/04/09    F Vag-Spont   LIV   1 Term 05/02/07    F Vag-Spont   LIV             Vitals  BP: 122/76  Weight - Scale: 98 kg (216 lb)  Pulse: 98  Patient Position: Sitting  Albumin: Negative  Glucose: Negative      The patient was seen and evaluated. There was Negative fetal movements. No contractions or leakage of fluid. Signs and symptoms of preterm labor as well as labor were reviewed.The Nuchal Translucency testing was reviewed and found to be normal. A single marker MSAFP was ordered for a 15-[redacted] week gestational age window. TOP ST OH Reviewed. Dates were reviewed with the patient.  An 18-22 week anatomy ultrasound has been ordered.  The patient will return to the office for her next visit in 4 weeks. See antepartum flow sheet.     The following was discussed:  A. Counseling on the incidents of birth defects in the general population being 2-4% and that ultrasound does not diagnose every form of congenital abnormality and has limitations .   B. The only way to evaluate the chromosomal makeup to near 100% certainty is with invasive testing such as chorionic villous sampling or amniocentesis.   C. The options for testing listed in (B) with detail and all risks/benefits and alternatives will be discussed in the high risk setting.   D. NIPT testing options will be discussed with the patient, taking a maternal blood sample and screening it for fetal cells then performing a genetic karyotype.  If this were to be positive for                    a trisomy then a confirmatory amniocentesis or CVS for confirmation would be needed.    E. TOPSTOH guidelines will be reviewed with the  patient.      2nd attempt to reach patient to offer Maternity case management benefits     EDD by TVUS:     High Risk Dx:      PRENATAL LAB RESULTS:   Pre-pregnancy: BMI  Blood Type/Rh: B+  Antibody Screen: neg  Hemoglobin, Hematocrit: 11.6/35.3  Rubella: immune  T. Pallidum, IgG: neg  Hepatitis B Surface Antigen: neg  TSH: 2.36  HIV: neg  Sickle Cell Screen: n/a  Gonorrhea: neg  Chlamydia: neg  Urine culture: neg    Early  hour Glucose Tolerance Test:     28 wk 1 hr GTT    Glucose Fasting: **  1 hour Glucose Tolerance Test: **  2 hour Glucose Tolerance Test: **  3 hour Glucose Tolerance Test: **    Group B Strep:      Cystic Fibrosis Screen:  neg  First Trimester Screen:  neg  Quad Screen:  Anatomy US :     Tdap: **; Date given: **  Flu shot:   COVID Shot:       Assessment:  1. Heather Huerta is a 42 y.o. female  2. [id number]  3. [redacted]w[redacted]d    Patient Active Problem List    Diagnosis Date Noted    GBS (group B Streptococcus carrier), +RV culture, currently pregnant 04/05/2023     Priority: High     03/2023 + GBS, treat in labor per protocol      Missed ab 06/27/2021     Priority: Medium    Antepartum multigravida of advanced maternal age 23/04/2023     Advanced Maternal Age Counseling    If a woman is over the age of 8, she is considered to be of Advanced Maternal Age, and with this title comes risks for mom and baby.   Increased risk of Down Syndrome - the most common chromosomal birth defect (chromosomes - cells that carry genes and transmit heredity information)   Increased risk of miscarriage   20% increase at ages 82 to 12   35% increase at ages 50-44   Over 50% increase by age 27  Increased risk of Cesarean Section (C-Section) for delivery method   Gestational diabetes - diabetes that develops for the first time during pregnancy. Women who have this could possibly have a very large baby who then is at risk for injuries during delivery.   Pregnancy Induced Hypertension - High blood pressure   Placental Problems - one  of the most common placental problems is placenta previa in which the placenta covers all or part of the uterine opening (cervix). This can cause severe bleeding during delivery and can make C-section delivery necessary.  Even with all of these increased risks, with the advancement in medical procedures and testing, there are several ways to reduce your risk. They include early and regular prenatal care, taking the multivitamin with folic acid prescribed by your health care manager, beginning pregnancy at a healthy weight, not smoking or drinking alcoholic beverages, and eating healthy foods.   There are tests that all pregnant women are encouraged to take, but there is additional prenatal testing that is regularly offered to any woman 42 or older because of the potential of increased risks to the mother and baby. These tests are chorionic villus sampling (CVS) and amniocentesis. NIPT is also available which is a non-invasive option for fetal karyotyping.  With CVS, a small sample of cells (called chorionic villi) is taken from the placenta where it attached to the wall of the uterus. Chorionic villi are tiny parts of the placenta; therefore they have the same genes as the baby. This test is 98% accurate, but can carry a slightly higher risk of miscarriage than amniocentesis, since the procedure is done in early pregnancy.   Amniocentesis is a procedure where a sample of fluid is removed from the amniotic sac for analysis and evaluation. During this procedure, fluid is removed by placing a long needle through the abdominal wall into the amniotic sac. The amniocentesis needle is typically guided into the sac with the help of ultrasound imaging. Once the needle is in the sac, a syringe is used to withdraw the clear amber-colored amniotic fluid, which looks a bit like urine.  NIPT, utilizes the maternal blood to test for fetal cells. These fetal cells are then karyotyped for genetic evaluation.  All of these tests,  CVS, NIPT and amniocentesis, let couples know if the fetus will have  genetic abnormalities, and will help them make informed decisions regarding their pregnancy. Karyotyping by either CVS, NIPT or Amniocentesis is the ONLY way to confirm the genetic chromosomal structure regarding trisomy; Down's Syndrome, Edward's or Pateau's. TOP ST OH was reviewed. If NIPT is positive then a confirmatory amniocentesis would be recommended to confirm the diagnosis.  TOPSTOH guidelines were reviewed.        Asymptomatic bacteriuria during pregnancy 02/24/2023    Vaginal bleeding in pregnancy 02/24/2023    Nasal polyp 10/17/2022        Diagnosis Orders   1. Antepartum multigravida of advanced maternal age  Alpha Fetoprotein, Maternal      2. GBS (group B Streptococcus carrier), +RV culture, currently pregnant        3. History of miscarriage  Alpha Fetoprotein, Maternal      4. Subchorionic hemorrhage of placenta in first trimester (Resolved)  Alpha Fetoprotein, Maternal      5. [redacted] weeks gestation of pregnancy  Alpha Fetoprotein, Maternal            Plan:  RTO in 4 weeks for OB visit.  Lab slip given for MSAFP.          Patient was seen with total face to face time of 20 minutes. More than 50% of this visit was on counseling and education regarding her    Diagnosis Orders   1. Antepartum multigravida of advanced maternal age  Alpha Fetoprotein, Maternal      2. GBS (group B Streptococcus carrier), +RV culture, currently pregnant        3. History of miscarriage  Alpha Fetoprotein, Maternal      4. Subchorionic hemorrhage of placenta in first trimester (Resolved)  Alpha Fetoprotein, Maternal      5. [redacted] weeks gestation of pregnancy  Alpha Fetoprotein, Maternal       and her options. She was also counseled on her preventative health maintenance recommendations and follow-up.

## 2023-05-03 NOTE — Telephone Encounter (Signed)
 Per Raynelle Fanning NP, pt calling in with c/o back pain; severe at times since Saturday.  Pt to take tylenol q 4 hrs, try ice on 20 mins/off 20 mins, can contact PCP, and phone number given for chiropractor that treats pregnant women.  Pt verbalized understanding.

## 2023-05-28 ENCOUNTER — Inpatient Hospital Stay: Payer: PRIVATE HEALTH INSURANCE | Primary: Student in an Organized Health Care Education/Training Program

## 2023-05-28 ENCOUNTER — Encounter
Admit: 2023-05-28 | Discharge: 2023-05-28 | Payer: PRIVATE HEALTH INSURANCE | Attending: Obstetrics & Gynecology | Primary: Student in an Organized Health Care Education/Training Program

## 2023-05-28 DIAGNOSIS — R300 Dysuria: Secondary | ICD-10-CM

## 2023-05-28 DIAGNOSIS — O09529 Supervision of elderly multigravida, unspecified trimester: Secondary | ICD-10-CM

## 2023-05-28 LAB — URINALYSIS WITH REFLEX TO CULTURE
Bilirubin, Urine: NEGATIVE
Glucose, Ur: NEGATIVE mg/dL
Nitrite, Urine: NEGATIVE
Protein, UA: NEGATIVE mg/dL
Specific Gravity, UA: 1.022 (ref 1.005–1.030)
Urine Hgb: NEGATIVE
Urobilinogen, Urine: NORMAL EU/dL (ref 0.0–1.0)
pH, Urine: 7 (ref 5.0–8.0)

## 2023-05-28 LAB — MICROSCOPIC URINALYSIS
Epithelial Cells, UA: 5 /HPF (ref 0–5)
RBC, UA: 0 /HPF (ref 0–2)
WBC, UA: 5 /HPF (ref 0–5)

## 2023-05-28 MED ORDER — AMPICILLIN 500 MG PO CAPS
500 | ORAL_CAPSULE | Freq: Four times a day (QID) | ORAL | 0 refills | Status: AC
Start: 2023-05-28 — End: 2023-06-07

## 2023-05-28 NOTE — Care Coordination-Inpatient (Signed)
Patient eligible for [facility] Manager contacted the patient by telephone to discuss the maternity management program.  Patient agrees to care management services at this time. Verified name and DOB with patient as identifiers.   Patient Current Location: [place]      Call within 2 business days of discharge: Yes     Last Discharge Facility       Date Complaint Diagnosis Description Type Department Provider    02/24/23 Vaginal Bleeding Vaginal bleeding in pregnancy ... ED (DISCHARGE) [facility] Vickie Epley, DO     Risk Factors Identified:  multip, ama, early bleeding in pregnancy      Needs to be reviewed by the provider   none         Method of communication with provider : none    Advance Care Planning:   Does patient have an Advance Directive:  not on file.     Does patient have OB/Gyn Selected? Yes    Discussed follow up appointments. If no appointment was previously scheduled, appointment scheduling offered: Yes  [facility] follow up appointment(s):   Future Appointments   Date Time Provider Department Center   06/09/2023  2:15 PM ULTRASONOGRAPHER 4 Mat Fetal [facility]   06/25/2023 10:15 AM Richrd Prime, Ailene Ards, DO [facility]   10/26/2023 11:20 AM Julianne Handler, MD [facility]     Non-[facility] follow up appointment(s): n/a    OB History:   OB History   Gravida Para Term Preterm AB Living   5 3 3   1 3    SAB IAB Ectopic Molar Multiple Live Births   1         3      # Outcome Date GA Lbr Len/2nd Weight Sex Type Anes PTL Lv   5 Current            4 SAB 05/2021           3 Term 11/17/16    F Vag-Spont   LIV   2 Term 05/04/09    F Vag-Spont   LIV   1 Term 05/02/07    F Vag-Spont   LIV     [id number]  [redacted]w[redacted]d    Medication reconciliation was performed with patient, who verbalizes understanding of administration of home medications. Advised obtaining a 90-day supply of all daily and as-needed medications.     Barriers/Support system:  spouse  Return to work  planning? Yes      2nd and 3rd Trimester Focused Assessment: 20w 2d. S/w pt today and introduced the [facility] program to pt. She is doing well thus far with no concerns, she is having no additional bleeding episodes since the previous ed visit. Pt is high risk pregnancy and she has all of her appointments thus far. Will continue to follow for anatomy scan. Unknown the sex of the baby.   Healthy behavior review  Fetal movement-yes  Red flags: bleeding/leaking  Labor signs and symptoms-none  Birth planning: breast feeding and pt will need a breast pump, this sm to send he p this info via [employer]. Delivery is planned for [facility]  Maternity Classes? No        Patient given an opportunity to ask questions and does not have any further questions or concerns at this time. Were discharge instructions available to patient? Yes. Reviewed appropriate site of care based on symptoms and resources available  to patient including: Benefits related nurse triage line  When to call 911  Condition related references. The patient agrees to contact the OB/Gyn office for questions related to their healthcare.    Plan for follow-up call in 10-14 days based on severity of symptoms and risk factors.  Plan for next call: self management-prenatal f/u plan 06/09/23

## 2023-05-28 NOTE — Progress Notes (Signed)
 Heather Huerta is a 42 y.o. female [redacted]w[redacted]d    [phone]    OB History   Gravida Para Term Preterm AB Living   5 3 3   1 3    SAB IAB Ectopic Molar Multiple Live Births   1         3      # Outcome Date GA Lbr Len/2nd Weight Sex Type Anes PTL Lv   5 Current            4 SAB 05/2021           3 Term 11/17/16    F Vag-Spont   LIV   2 Term 05/04/09    F Vag-Spont   LIV   1 Term 05/02/07    F Vag-Spont   LIV       Vitals  BP: 118/70  Weight - Scale: 98.4 kg (217 lb)  Pulse: 82  Patient Position: Sitting  Albumin: Negative  Glucose: Negative    The patient was seen and evaluated. Patient feeling fluttering.. No contractions or leakage of fluid. Signs and symptoms of preterm labor as well as labor were reviewed.The Nuchal Translucency testing was reviewed and found to be normal A single marker MSAFP was reviewed and found to be normal. The patients anatomy ultrasound has been completed and reviewed with patient. TOP ST OH Reviewed. A 28 week lab panel was ordered. This includes a (HH, 1 hr GTT, U/A C&S). The patient is to complete this in the next two to four weeks. Pt is feeling burning with urination. U/A C&S collected. Will treat prophylactically    The following was discussed:  A. Counseling on the incidents of birth defects in the general population being 2-4% and that ultrasound does not diagnose every form of congenital abnormality and has limitations .   B. The only way to evaluate the chromosomal makeup to near 100% certainty is with invasive testing such as chorionic villous sampling or amniocentesis.   C. The options for testing listed in (B) with detail and all risks/benefits and alternatives will be discussed in the high risk setting.   D. NIPT testing options will be discussed with the patient, taking a maternal blood sample and screening it for fetal cells then performing a genetic karyotype.  If this were to be positive for                    a trisomy then a confirmatory amniocentesis or CVS for  confirmation would be needed.    E. TOPSTOH guidelines will be reviewed with the patient.      The S/S of Pre-Eclampsia were reviewed with the patient in detail. She is to report any of these if they occur. She currently denies any of these.    The patient is RH positive Rhogam Ordered no    The patient was instructed on fetal kick counts and was given a kick sheet to complete every 8 hours. This is to begin at [redacted] weeks gestation. She was instructed that the baby should move at a minimum of ten times within one hour after a meal. The patient was instructed to lay down on her left side twenty minutes after eating and count movements for up to one hour with a target value of ten movements.  She was instructed to notify the office if she did not make that target after two attempts or if after any attempt there was less than four movements.  2nd attempt to reach patient to offer Maternity case management benefits     EDD by TVUS:     High Risk Dx:      PRENATAL LAB RESULTS:   Pre-pregnancy: BMI  Blood Type/Rh: B+  Antibody Screen: neg  Hemoglobin, Hematocrit: 11.6/35.3  Rubella: immune  T. Pallidum, IgG: neg  Hepatitis B Surface Antigen: neg  TSH: 2.36  HIV: neg  Sickle Cell Screen: n/a  Gonorrhea: neg  Chlamydia: neg  Urine culture: neg    Early  hour Glucose Tolerance Test:     28 wk 1 hr GTT    Glucose Fasting: **  1 hour Glucose Tolerance Test: **  2 hour Glucose Tolerance Test: **  3 hour Glucose Tolerance Test: **    Group B Strep:      Cystic Fibrosis Screen:  neg  First Trimester Screen:  neg  Quad Screen:    Anatomy US :     Tdap: **; Date given: **  Flu shot:   COVID Shot:     Assessment:  1. Heather Huerta is a 42 y.o. female  2. [id number]  3. [redacted]w[redacted]d  4. Clinical UTI. Rx ampicillin  for h/o GBS bacteruria    Patient Active Problem List    Diagnosis Date Noted    GBS (group B Streptococcus carrier), +RV culture, currently pregnant 04/05/2023     Priority: High     03/2023 + GBS, treat in labor per protocol       Missed ab 06/27/2021     Priority: Medium    Antepartum multigravida of advanced maternal age 40/04/2023     Advanced Maternal Age Counseling    If a woman is over the age of 18, she is considered to be of Advanced Maternal Age, and with this title comes risks for mom and baby.   Increased risk of Down Syndrome - the most common chromosomal birth defect (chromosomes - cells that carry genes and transmit heredity information)   Increased risk of miscarriage   20% increase at ages 50 to 74   35% increase at ages 18-44   Over 50% increase by age 36  Increased risk of Cesarean Section (C-Section) for delivery method   Gestational diabetes - diabetes that develops for the first time during pregnancy. Women who have this could possibly have a very large baby who then is at risk for injuries during delivery.   Pregnancy Induced Hypertension - High blood pressure   Placental Problems - one of the most common placental problems is placenta previa in which the placenta covers all or part of the uterine opening (cervix). This can cause severe bleeding during delivery and can make C-section delivery necessary.  Even with all of these increased risks, with the advancement in medical procedures and testing, there are several ways to reduce your risk. They include early and regular prenatal care, taking the multivitamin with folic acid prescribed by your health care manager, beginning pregnancy at a healthy weight, not smoking or drinking alcoholic beverages, and eating healthy foods.   There are tests that all pregnant women are encouraged to take, but there is additional prenatal testing that is regularly offered to any woman 78 or older because of the potential of increased risks to the mother and baby. These tests are chorionic villus sampling (CVS) and amniocentesis. NIPT is also available which is a non-invasive option for fetal karyotyping.  With CVS, a small sample of cells (called chorionic villi) is taken from the  placenta where  it attached to the wall of the uterus. Chorionic villi are tiny parts of the placenta; therefore they have the same genes as the baby. This test is 98% accurate, but can carry a slightly higher risk of miscarriage than amniocentesis, since the procedure is done in early pregnancy.   Amniocentesis is a procedure where a sample of fluid is removed from the amniotic sac for analysis and evaluation. During this procedure, fluid is removed by placing a long needle through the abdominal wall into the amniotic sac. The amniocentesis needle is typically guided into the sac with the help of ultrasound imaging. Once the needle is in the sac, a syringe is used to withdraw the clear amber-colored amniotic fluid, which looks a bit like urine.  NIPT, utilizes the maternal blood to test for fetal cells. These fetal cells are then karyotyped for genetic evaluation.  All of these tests, CVS, NIPT and amniocentesis, let couples know if the fetus will have genetic abnormalities, and will help them make informed decisions regarding their pregnancy. Karyotyping by either CVS, NIPT or Amniocentesis is the ONLY way to confirm the genetic chromosomal structure regarding trisomy; Down's Syndrome, Edward's or Pateau's. TOP ST OH was reviewed. If NIPT is positive then a confirmatory amniocentesis would be recommended to confirm the diagnosis.  TOPSTOH guidelines were reviewed.        Asymptomatic bacteriuria during pregnancy 02/24/2023    Vaginal bleeding in pregnancy 02/24/2023    Nasal polyp 10/17/2022        Diagnosis Orders   1. Antepartum multigravida of advanced maternal age        2. GBS (group B Streptococcus carrier), +RV culture, currently pregnant        3. [redacted] weeks gestation of pregnancy        4. Dysuria  Urinalysis with Reflex to Culture            Plan:  The patient will return to the office for her next visit in 4 weeks. See antepartum flow sheet.         Patient was seen with total face to face time of 20  minutes. More than 50% of this visit was on counseling and education regarding her    Diagnosis Orders   1. Antepartum multigravida of advanced maternal age        2. GBS (group B Streptococcus carrier), +RV culture, currently pregnant        3. [redacted] weeks gestation of pregnancy        4. Dysuria  Urinalysis with Reflex to Culture       and her options. She was also counseled on her preventative health maintenance recommendations and follow-up.

## 2023-06-09 ENCOUNTER — Ambulatory Visit
Admit: 2023-06-09 | Discharge: 2023-06-09 | Payer: PRIVATE HEALTH INSURANCE | Attending: Maternal & Fetal Medicine | Primary: Student in an Organized Health Care Education/Training Program

## 2023-06-09 DIAGNOSIS — O09522 Supervision of elderly multigravida, second trimester: Secondary | ICD-10-CM

## 2023-06-09 NOTE — Progress Notes (Signed)
Almeta Monas, was evaluated through a synchronous (real-time) audio-video encounter. The patient (or guardian if applicable) is aware that this is a billable service, which includes applicable co-pays. This Virtual Visit was conducted with patient's (and/or legal guardian's) consent. Patient identification was verified, and a caregiver was present when appropriate.   The patient was located at [facility] (Appt Department): [address]  [place],  [place] [address]  Provider was located at Home (Appt Dept State): [place].  Confirm you are appropriately licensed, registered, or certified to deliver care in the state where the patient is located as indicated above. If you are not or unsure, please re-schedule the visit: Yes, I confirm.      Total time spent for this encounter:  approximately 30 minutes (see dictation).    --Erma Pinto, MD on 06/09/2023 at 4:12 PM    An electronic signature was used to authenticate this note.

## 2023-06-09 NOTE — Patient Instructions (Signed)
Patient advised to follow up with referring provider

## 2023-06-09 NOTE — Progress Notes (Signed)
Please see attached initial consultation letter.

## 2023-06-09 NOTE — Progress Notes (Signed)
Please refer to attached ultrasound report for doctor's evaluation of the clinical information obtained by vital signs, ultrasound, and/or non-stress test along with management recommendation.

## 2023-06-11 NOTE — Care Coordination-Inpatient (Signed)
ACM attempted to reach patient for Maternity Care Management/ Care transitions call. HIPAA compliant message left requesting a return phone call at patients convenience. Will continue to follow.

## 2023-06-25 ENCOUNTER — Encounter
Payer: PRIVATE HEALTH INSURANCE | Attending: Family | Primary: Student in an Organized Health Care Education/Training Program

## 2023-07-02 ENCOUNTER — Encounter
Admit: 2023-07-02 | Discharge: 2023-07-02 | Payer: PRIVATE HEALTH INSURANCE | Attending: Women's Health | Primary: Student in an Organized Health Care Education/Training Program

## 2023-07-02 DIAGNOSIS — O09529 Supervision of elderly multigravida, unspecified trimester: Secondary | ICD-10-CM

## 2023-07-02 NOTE — Progress Notes (Signed)
Heather Huerta is a 42 y.o. female [redacted]w[redacted]d    [phone]    OB History   Gravida Para Term Preterm AB Living   5 3 3   1 3    SAB IAB Ectopic Molar Multiple Live Births   1         3      # Outcome Date GA Lbr Len/2nd Weight Sex Type Anes PTL Lv   5 Current            4 SAB 05/2021           3 Term 11/17/16    F Vag-Spont   LIV   2 Term 05/04/09    F Vag-Spont   LIV   1 Term 05/02/07    F Vag-Spont   LIV       Vitals  BP: 116/72  Weight - Scale: 101.2 kg (223 lb)  Pulse: 99  Patient Position: Sitting  Albumin: Negative  Glucose: Negative    The patient was seen and evaluated. There was positive fetal movements. No contractions or leakage of fluid. Signs and symptoms of preterm labor as well as labor were reviewed.The Nuchal Translucency testing was reviewed and found to be normal A single marker MSAFP was reviewed and found to be did not complete . The patients anatomy ultrasound has been completed and reviewed with patient. TOP ST OH Reviewed. A 28 week lab panel was ordered. This includes a (HH, 1 hr GTT, U/A C&S). The patient is to complete this in the next two to four weeks.    The following was discussed:  A. Counseling on the incidents of birth defects in the general population being 2-4% and that ultrasound does not diagnose every form of congenital abnormality and has limitations .   B. The only way to evaluate the chromosomal makeup to near 100% certainty is with invasive testing such as chorionic villous sampling or amniocentesis.   C. The options for testing listed in (B) with detail and all risks/benefits and alternatives will be discussed in the high risk setting.   D. NIPT testing options will be discussed with the patient, taking a maternal blood sample and screening it for fetal cells then performing a genetic karyotype.  If this were to be positive for                    a trisomy then a confirmatory amniocentesis or CVS for confirmation would be needed.    E. TOPSTOH guidelines will be reviewed  with the patient.      The S/S of Pre-Eclampsia were reviewed with the patient in detail. She is to report any of these if they occur. She currently denies any of these.    The patient is RH positive Rhogam Ordered no    The patient was instructed on fetal kick counts and was given a kick sheet to complete every 8 hours. This is to begin at [redacted] weeks gestation. She was instructed that the baby should move at a minimum of ten times within one hour after a meal. The patient was instructed to lay down on her left side twenty minutes after eating and count movements for up to one hour with a target value of ten movements.  She was instructed to notify the office if she did not make that target after two attempts or if after any attempt there was less than four movements.      Patient is currently enrolled in Maternity  Care Management Episode  Start day 910/4/24  Risk for Admission or ED Visit: low  Please see patient outreach encounters for further details.  For questions contact:   Johnnye Lana, RN MSN/ed Associate Care Manager  [facility]   Cell [phone]  [email or web address]     EDD by TVUS:     High Risk Dx:      PRENATAL LAB RESULTS:   Pre-pregnancy: BMI  Blood Type/Rh: B+  Antibody Screen: neg  Hemoglobin, Hematocrit: 11.6/35.3  Rubella: immune  T. Pallidum, IgG: neg  Hepatitis B Surface Antigen: neg  TSH: 2.36  HIV: neg  Sickle Cell Screen: n/a  Gonorrhea: neg  Chlamydia: neg  Urine culture: neg    Early  hour Glucose Tolerance Test:     28 wk 1 hr GTT    Glucose Fasting: **  1 hour Glucose Tolerance Test: **  2 hour Glucose Tolerance Test: **  3 hour Glucose Tolerance Test: **    Group B Strep:      Cystic Fibrosis Screen:  neg  First Trimester Screen:  neg  Quad Screen:    Anatomy US:     Tdap: **; Date given: **  Flu shot:   COVID Shot:     Assessment:  1. Nayleah Marcin is a 42 y.o. female  2. [id number]  3. 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   Increased risk of Down Syndrome - the most common chromosomal birth defect (chromosomes - cells that carry genes and transmit heredity information)   Increased risk of miscarriage   20% increase at ages 37 to 81   35% increase at ages 57-44   Over 50% increase by age 34  Increased risk of Cesarean Section (C-Section) for delivery method   Gestational diabetes - diabetes that develops for the first time during pregnancy. Women who have this could possibly have a very large baby who then is at risk for injuries during delivery.   Pregnancy Induced Hypertension - High blood pressure   Placental Problems - one of the most common placental problems is placenta previa in which the placenta covers all or part of the uterine opening (cervix). This can cause severe bleeding during delivery and can make C-section delivery necessary.  Even with all of these increased risks, with the advancement in medical procedures and testing, there are several ways to reduce your risk. They include early and regular prenatal care, taking the multivitamin with folic acid prescribed by your health care manager, beginning pregnancy at a healthy weight, not smoking or drinking alcoholic beverages, and eating healthy foods.   There are tests that all pregnant women are encouraged to take, but there is additional prenatal testing that is regularly offered to any woman 42 or older because of the potential of increased  risks to the mother and  baby. These tests are chorionic villus sampling (CVS) and amniocentesis. NIPT is also available which is a non-invasive option for fetal karyotyping.  With CVS, a small sample of cells (called chorionic villi) is taken from the placenta where it attached to the wall of the uterus. Chorionic villi are tiny parts of the placenta; therefore they have the same genes as the baby. This test is 98% accurate, but can carry a slightly higher risk of miscarriage than amniocentesis, since the procedure is done in early pregnancy.   Amniocentesis is a procedure where a sample of fluid is removed from the amniotic sac for analysis and evaluation. During this procedure, fluid is removed by placing a long needle through the abdominal wall into the amniotic sac. The amniocentesis needle is typically guided into the sac with the help of ultrasound imaging. Once the needle is in the sac, a syringe is used to withdraw the clear amber-colored amniotic fluid, which looks a bit like urine.  NIPT, utilizes the maternal blood to test for fetal cells. These fetal cells are then karyotyped for genetic evaluation.  All of these tests, CVS, NIPT and amniocentesis, let couples know if the fetus will have genetic abnormalities, and will help them make informed decisions regarding their pregnancy. Karyotyping by either CVS, NIPT or Amniocentesis is the ONLY way to confirm the genetic chromosomal structure regarding trisomy; Down's Syndrome, Edward's or Pateau's. TOP ST OH was reviewed. If NIPT is positive then a confirmatory amniocentesis would be recommended to confirm the diagnosis.  TOPSTOH guidelines were reviewed.        Asymptomatic bacteriuria during pregnancy 02/24/2023    Vaginal bleeding in pregnancy 02/24/2023    Nasal polyp 10/17/2022        Diagnosis Orders   1. Antepartum multigravida of advanced maternal age        2. GBS (group B Streptococcus carrier), +RV culture, currently pregnant        3. 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 History of miscarriage        5. Subchorionic hemorrhage of placenta in first trimester (Resolved)        6. [redacted] weeks gestation of pregnancy              Plan:  The patient will return to the office for her next visit in 3 weeks. See antepartum flow sheet.   Reviewed s/sx of preterm labor and preeclampsia  Continue FKC to start at 28 weeks       Patient was seen with total face to face time of 20 minutes. More than 50% of this visit was on counseling and education regarding her    Diagnosis Orders   1. Antepartum multigravida of advanced maternal age        2. GBS (group B Streptococcus carrier), +RV culture, currently pregnant        3. 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 History of miscarriage        5. Subchorionic hemorrhage of placenta in first trimester (Resolved)        6. [redacted] weeks gestation of pregnancy         and her options. She was also counseled on her preventative health maintenance recommendations and follow-up.

## 2023-07-03 ENCOUNTER — Inpatient Hospital Stay
Admit: 2023-07-03 | Discharge: 2023-07-04 | Disposition: A | Discharge status: Home / Self Care | Payer: PRIVATE HEALTH INSURANCE | Attending: Emergency Medicine

## 2023-07-03 DIAGNOSIS — M79604 Pain in right leg: Secondary | ICD-10-CM

## 2023-07-03 DIAGNOSIS — O99891 Other specified diseases and conditions complicating pregnancy: Secondary | ICD-10-CM

## 2023-07-03 NOTE — Discharge Instructions (Signed)
Take Lovenox 100 mg subcutaneous twice per day until you get an ultrasound of your right leg.  Go to either [facility] or [facility] around 7 AM in the morning on Monday with your requisition form for a venous duplex ultrasound of the right lower extremity, they should guide you on where to go to have this ultrasound done.  If you are ultrasound is negative stop the Lovenox, if you are positive they will facilitate getting you started on a longer course of anticoagulant (likely Lovenox).  Use Tylenol as needed for pain.  If you have any concerning symptoms such as chest pain or trouble breathing come back to the ER immediately.

## 2023-07-03 NOTE — ED Provider Notes (Signed)
EMERGENCY DEPARTMENT ENCOUNTER    Pt Name: Heather Huerta  MRN: [id number]  Birthdate [birth year]  Date of evaluation: 07/04/23  CHIEF COMPLAINT       Chief Complaint   Patient presents with    Leg Swelling     Worsening pain , lower posterior, onset 10 days ago.  Pt is preg 25weeks and 4 days, concerned about DVT     HISTORY OF PRESENT ILLNESS   HPI  41F [redacted] wks pregnant presents to the ED For R leg pain for the past few days. Pt states she started having pain in her R hip and thigh a few days ago, today she is feeling the pain in her R calf as well. She endorses mild swelling. She denies trauma, weakness/numbness, back pain, vaginal bleeding/discharge, abd pain, CP, SOB or any other acute complaints. Pt is concerned about DVT.     REVIEW OF SYSTEMS     Review of Systems   All other systems reviewed and are negative.    PASTMEDICAL HISTORY   No past medical history on file.  SURGICAL HISTORY       Past Surgical History:   Procedure Laterality Date    INTRAUTERINE DEVICE INSERTION N/A 08/09/2018    MIrena insertion NDC [id number]     CURRENT MEDICATIONS       Discharge Medication List as of 07/03/2023  8:25 PM        CONTINUE these medications which have NOT CHANGED    Details   famotidine (PEPCID) 20 MG tablet Take 1 tablet by mouth 2 times daily, Disp-60 tablet, R-3Normal      aspirin (ASPIRIN CHILDRENS) 81 MG chewable tablet Take 1 tablet by mouth daily, Disp-30 tablet, R-8Normal      Prenatal Vit-Fe Fumarate-FA (PRENATAL VITAMIN) 27-0.8 MG TABS Take 1 tablet by mouth daily, Disp-30 tablet, R-11Normal           ALLERGIES     has No Known Allergies.  FAMILY HISTORY     She indicated that her mother is alive. She indicated that her father is deceased. She indicated that the status of her brother is unknown.     SOCIAL HISTORY       Social History     Tobacco Use    Smoking status: Never    Smokeless tobacco: Never   Vaping Use    Vaping status: Never Used   Substance Use Topics    Alcohol use: Not  Currently    Drug use: Never     PHYSICAL EXAM     INITIAL VITALS: BP 115/63   Pulse 91   Temp 97.9 F (36.6 C) (Oral)   Resp 16   Ht 1.753 m (5\' 9" )   Wt 99.8 kg (220 lb)   LMP 01/06/2023 (Exact Date)   SpO2 100%   BMI 32.49 kg/m    Physical Exam  Constitutional:       General: She is not in acute distress.     Appearance: Normal appearance. She is normal weight.   HENT:      Head: Normocephalic and atraumatic.      Right Ear: Tympanic membrane and external ear normal.      Left Ear: Tympanic membrane and external ear normal.      Nose: Nose normal.      Mouth/Throat:      Mouth: Mucous membranes are moist.   Eyes:      Extraocular Movements: Extraocular movements intact.      Conjunctiva/sclera:  Conjunctivae normal.      Pupils: Pupils are equal, round, and reactive to light.   Cardiovascular:      Rate and Rhythm: Normal rate and regular rhythm.      Pulses: Normal pulses.      Heart sounds: Normal heart sounds. No murmur heard.  Pulmonary:      Effort: Pulmonary effort is normal. No respiratory distress.      Breath sounds: Normal breath sounds. No wheezing or rhonchi.   Abdominal:      General: Bowel sounds are normal. There is no distension.      Palpations: Abdomen is soft. There is no mass.      Tenderness: There is no abdominal tenderness. There is no right CVA tenderness, left CVA tenderness, guarding or rebound.   Musculoskeletal:         General: No swelling or deformity.      Cervical back: Normal range of motion and neck supple. No rigidity.      Comments: Mild R calf tenderness   Skin:     Capillary Refill: Capillary refill takes less than 2 seconds.      Coloration: Skin is not jaundiced.      Findings: No bruising, erythema or rash.   Neurological:      General: No focal deficit present.      Mental Status: She is alert and oriented to person, place, and time.      Cranial Nerves: No cranial nerve deficit.      Sensory: No sensory deficit.   Psychiatric:         Mood and Affect: Mood  normal.         Behavior: Behavior normal.         MEDICAL DECISION MAKING/ED Course:     - CBC, BMP, d-dimer  - discussed case w/Dr. Anola Gurney from [facility]. [facility] OB/gyn  - 1 mg/kg IM lovenox    21F presents to the ED for R leg pain for the past few days. She is well-appearing with stable vitals, mild R calf tenderness on exam. Ddx includes DVT, MSK leg pain. Labs with elevated d-dimer. We do not currently have access to vascular US. After discussion with OB pt will be started on lovenox and provided an order form to obtain a RLE venous duplex US on Monday morning at a [facility] facility. She was given first dose here, given return precautions, discharged in stable condition.     DIAGNOSTIC RESULTS       RADIOLOGY:All plain film, CT, MRI, and formal ultrasound images (except ED bedside ultrasound) are read by the radiologist, see reports below, unless otherwisenoted in MDM or here.  Vascular duplex lower extremity venous right    (Results Pending)     LABS: All lab results were reviewed by myself, and all abnormals are listed below.  Labs Reviewed   CBC WITH AUTO DIFFERENTIAL - Abnormal; Notable for the following components:       Result Value    RBC 3.42 (*)     Hemoglobin 10.8 (*)     Hematocrit 31.5 (*)     Neutrophils % 67 (*)     All other components within normal limits   BASIC METABOLIC PANEL - Abnormal; Notable for the following components:    Glucose 106 (*)     All other components within normal limits   D-DIMER, QUANTITATIVE - Abnormal; Notable for the following components:    D-Dimer, Quant 1.49 (*)     All  other components within normal limits                Vitals:    Vitals:    07/03/23 1810 07/03/23 1824   BP: 115/63    Pulse: 91    Resp: 16    Temp: 97.9 F (36.6 C)    TempSrc: Oral    SpO2: (!) 10% 100%   Weight: 99.8 kg (220 lb)    Height: 1.753 m (5\' 9" )        The patient was given the following medications while in the emergency department:  Orders Placed This Encounter   Medications    enoxaparin  (LOVENOX) injection 100 mg     Order Specific Question:   Indication of Use     Answer:   Treatment-DVT/PE    enoxaparin (LOVENOX) 100 MG/ML     Sig: Inject 1 mL into the skin 2 times daily for 7 days     Dispense:  14 mL     Refill:  0     CONSULTS:  None    FINAL IMPRESSION      1. Right leg pain    2. Elevated d-dimer          DISPOSITION/PLAN   DISPOSITION Decision To Discharge 07/03/2023 08:22:26 PM           PATIENT REFERRED TO:  Julianne Handler, MD  [address]  [address]  [place] [address]  [phone]    Schedule an appointment as soon as possible for a visit       DISCHARGE MEDICATIONS:  Discharge Medication List as of 07/03/2023  8:25 PM        START taking these medications    Details   enoxaparin (LOVENOX) 100 MG/ML Inject 1 mL into the skin 2 times daily for 7 days, Disp-14 mL, R-0Normal           Paulina Fusi, MD  Attending Emergency Physician                    Paulina Fusi, MD  07/04/23 1323

## 2023-07-04 LAB — BASIC METABOLIC PANEL
Anion Gap: 12 mmol/L (ref 9–16)
BUN: 6 mg/dL (ref 6–20)
CO2: 20 mmol/L (ref 20–31)
Calcium: 9 mg/dL (ref 8.6–10.4)
Chloride: 105 mmol/L (ref 98–107)
Creatinine: 0.6 mg/dL (ref 0.50–0.90)
Est, Glom Filt Rate: >90 mL/min/{1.73_m2} (ref >60)
Glucose: 106 mg/dL — ABNORMAL HIGH (ref 74–99)
Potassium: 3.7 mmol/L (ref 3.7–5.3)
Sodium: 136 mmol/L (ref 136–145)

## 2023-07-04 LAB — CBC WITH AUTO DIFFERENTIAL
Basophils %: 1 % (ref 0–2)
Basophils Absolute: 0.06 10*3/uL (ref 0.00–0.20)
Eosinophils %: 1 % (ref 1–4)
Eosinophils Absolute: 0.09 10*3/uL (ref 0.00–0.44)
Hematocrit: 31.5 % — ABNORMAL LOW (ref 36.3–47.1)
Hemoglobin: 10.8 g/dL — ABNORMAL LOW (ref 11.9–15.1)
Immature Granulocytes %: 0 %
Immature Granulocytes Absolute: 0.02 10*3/uL (ref 0.00–0.30)
Lymphocytes %: 25 % (ref 24–43)
Lymphocytes Absolute: 2.01 10*3/uL (ref 1.10–3.70)
MCH: 31.6 pg (ref 25.2–33.5)
MCHC: 34.3 g/dL (ref 28.4–34.8)
MCV: 92.1 fL (ref 82.6–102.9)
MPV: 11 fL (ref 8.1–13.5)
Monocytes %: 6 % (ref 3–12)
Monocytes Absolute: 0.46 10*3/uL (ref 0.10–1.20)
NRBC Automated: 0 /100{WBCs}
Neutrophils %: 67 % — ABNORMAL HIGH (ref 36–65)
Neutrophils Absolute: 5.32 10*3/uL (ref 1.50–8.10)
Platelets: 173 10*3/uL (ref 138–453)
RBC: 3.42 m/uL — ABNORMAL LOW (ref 3.95–5.11)
RDW: 13.5 % (ref 11.8–14.4)
WBC: 8 10*3/uL (ref 3.5–11.3)

## 2023-07-04 LAB — D-DIMER, QUANTITATIVE: D-Dimer, Quant: 1.49 ug{FEU}/mL — ABNORMAL HIGH (ref 0.00–0.59)

## 2023-07-04 MED ORDER — ENOXAPARIN SODIUM 100 MG/ML IJ SOSY
100 | Freq: Two times a day (BID) | INTRAMUSCULAR | 0 refills | Status: AC
Start: 2023-07-04 — End: 2023-07-10

## 2023-07-04 MED ORDER — ENOXAPARIN SODIUM 100 MG/ML IJ SOSY
100 | Freq: Once | INTRAMUSCULAR | Status: AC
Start: 2023-07-04 — End: 2023-07-03
  Administered 2023-07-04: 02:00:00 100 mg/kg via SUBCUTANEOUS

## 2023-07-04 MED FILL — ENOXAPARIN SODIUM 100 MG/ML IJ SOSY: 100 MG/ML | INTRAMUSCULAR | Qty: 1

## 2023-07-05 ENCOUNTER — Inpatient Hospital Stay
Admit: 2023-07-05 | Payer: PRIVATE HEALTH INSURANCE | Attending: Emergency Medicine | Primary: Student in an Organized Health Care Education/Training Program

## 2023-07-05 DIAGNOSIS — M79604 Pain in right leg: Secondary | ICD-10-CM

## 2023-07-05 NOTE — Care Coordination-Inpatient (Signed)
 ACM attempted to reach patient for Maternity Care Management/ Care transitions call. HIPAA compliant message left requesting a return phone call at patients convenience. Will continue to follow.

## 2023-07-28 ENCOUNTER — Ambulatory Visit
Admit: 2023-07-28 | Payer: PRIVATE HEALTH INSURANCE | Attending: Obstetrics & Gynecology | Admitting: Maternal & Fetal Medicine | Primary: Student in an Organized Health Care Education/Training Program

## 2023-07-28 VITALS — BP 102/60 | HR 97 | Temp 97.50000°F | Resp 16 | Ht 70.0 in | Wt 222.0 lb

## 2023-07-28 DIAGNOSIS — Z3A29 29 weeks gestation of pregnancy: Principal | ICD-10-CM

## 2023-07-28 NOTE — Progress Notes (Signed)
 Please refer to attached ultrasound report for doctor's evaluation of the clinical information obtained by vital signs, ultrasound, and/or non-stress test along with management recommendation.

## 2023-07-28 NOTE — Patient Instructions (Signed)
 Pt advised to follow up with referring provider.

## 2023-07-30 ENCOUNTER — Encounter
Admit: 2023-07-30 | Payer: PRIVATE HEALTH INSURANCE | Admitting: Family | Primary: Student in an Organized Health Care Education/Training Program

## 2023-07-30 ENCOUNTER — Inpatient Hospital Stay
Disposition: A | Discharge status: Home / Self Care | Payer: PRIVATE HEALTH INSURANCE | Source: Ambulatory Visit | Admitting: Family | Primary: Student in an Organized Health Care Education/Training Program

## 2023-07-30 VITALS — BP 122/78 | HR 86 | Wt 225.0 lb

## 2023-07-30 DIAGNOSIS — O09529 Supervision of elderly multigravida, unspecified trimester: Principal | ICD-10-CM

## 2023-07-30 DIAGNOSIS — Z3A29 29 weeks gestation of pregnancy: Principal | ICD-10-CM

## 2023-07-30 LAB — GLUCOSE TOLERANCE, 1 HOUR: Glucose, GTT - 1 Hour: 174 mg/dL (ref 65–184)

## 2023-07-30 LAB — CBC WITH AUTO DIFFERENTIAL
Basophils %: 1 % (ref 0–2)
Basophils Absolute: 0.1 10*3/uL (ref 0.0–0.2)
Eosinophils %: 1 % (ref 0–4)
Eosinophils Absolute: 0.1 10*3/uL (ref 0.0–0.4)
Hematocrit: 33 % — ABNORMAL LOW (ref 36–46)
Hemoglobin: 11.3 g/dL — ABNORMAL LOW (ref 12.0–16.0)
Lymphocytes %: 22 % — ABNORMAL LOW (ref 24–44)
Lymphocytes Absolute: 1.5 10*3/uL (ref 1.0–4.8)
MCH: 32 pg (ref 26–34)
MCHC: 34.1 g/dL (ref 31–37)
MCV: 94 fL (ref 80–100)
MPV: 9.4 fL (ref 6.0–12.0)
Monocytes %: 5 % (ref 1–7)
Monocytes Absolute: 0.3 10*3/uL (ref 0.1–1.3)
Neutrophils %: 71 % — ABNORMAL HIGH (ref 36–66)
Neutrophils Absolute: 5 10*3/uL (ref 1.3–9.1)
Platelets: 164 10*3/uL (ref 150–450)
RBC: 3.52 m/uL — ABNORMAL LOW (ref 4.0–5.2)
RDW: 14.4 % (ref 11.5–14.9)
WBC: 7 10*3/uL (ref 3.5–11.0)

## 2023-07-30 LAB — URINALYSIS WITH REFLEX TO CULTURE
Bilirubin, Urine: NEGATIVE
Glucose, Ur: NEGATIVE mg/dL
Nitrite, Urine: NEGATIVE
Protein, UA: NEGATIVE mg/dL
Specific Gravity, UA: 1.016 (ref 1.000–1.030)
Urine Hgb: NEGATIVE
Urobilinogen, Urine: NORMAL EU/dL (ref 0.0–1.0)
pH, Urine: 7 (ref 5.0–8.0)

## 2023-07-30 LAB — MICROSCOPIC URINALYSIS
Casts UA: 0 /LPF — AB
Epithelial Cells, UA: 3 /HPF
RBC, UA: 3 /HPF — AB
WBC, UA: 6 /HPF — AB

## 2023-07-30 NOTE — Progress Notes (Signed)
.  mbobn        Alinna Siple is a 42 y.o. female [redacted]w[redacted]d    [phone]    OB History   Gravida Para Term Preterm AB Living   5 3 3   1 3    SAB IAB Ectopic Molar Multiple Live Births   1         3      # Outcome Date GA Lbr Len/2nd Weight Sex Type Anes PTL Lv

## 2023-07-30 NOTE — Care Coordination-Inpatient (Signed)
 ACM attempted to reach patient for Maternity Care Management/ Care transitions call. HIPAA compliant message left requesting a return phone call at patients convenience. Will continue to follow.

## 2023-08-02 ENCOUNTER — Telehealth: Admit: 2023-08-02 | Admitting: Family

## 2023-08-02 DIAGNOSIS — R7309 Other abnormal glucose: Secondary | ICD-10-CM

## 2023-08-02 NOTE — Telephone Encounter (Signed)
-----   Message from [facility], APRN - CNP sent at 08/02/2023  8:04 AM EST -----  Abnormal 1 hour GTT  Please let patient know and order 3 hour GTT and hgb A1c.  Hold UA for C&S results.

## 2023-08-02 NOTE — Telephone Encounter (Signed)
 Information given to Maralyn Sago to write letter for patient.

## 2023-08-02 NOTE — Telephone Encounter (Signed)
 [facility] OB[facility] Result Note Patient Contact Communication   [address] [place], [place] [address]      08/02/23   4:24 PM     Patients Name: Heather Huerta   Medical Record Number: [id number]   Contact Serial Number: [id number]  Date of Birth

## 2023-08-02 NOTE — Telephone Encounter (Signed)
 Please review

## 2023-08-04 NOTE — Telephone Encounter (Signed)
 Pt called asking for dr Willette Pa to re order her sleep study order please call pt with questions at phone number [phone]

## 2023-08-05 NOTE — Telephone Encounter (Signed)
Left msg regarding sleep study order. The order is still in there and active, will expire in one year.

## 2023-08-11 ENCOUNTER — Encounter: Admit: 2023-08-11 | Admitting: Obstetrics & Gynecology

## 2023-08-11 ENCOUNTER — Inpatient Hospital Stay
Disposition: A | Discharge status: Home / Self Care | Payer: PRIVATE HEALTH INSURANCE | Admitting: Family | Primary: Student in an Organized Health Care Education/Training Program

## 2023-08-11 ENCOUNTER — Telehealth: Admit: 2023-08-11 | Admitting: Family

## 2023-08-11 DIAGNOSIS — R7309 Other abnormal glucose: Principal | ICD-10-CM

## 2023-08-11 LAB — GLUCOSE TOLERANCE, 3 HOURS
Glucose, Fasting: 156 mg/dL
Glucose, GTT - 1 Hour: 184 mg/dL
Glucose, GTT - 2 Hour: 171 mg/dL
Glucose, GTT - 3 Hour: 88 mg/dL

## 2023-08-11 LAB — HEMOGLOBIN A1C
Estimated Avg Glucose: 117 mg/dL
Hemoglobin A1C: 5.7 % (ref 4.0–6.0)

## 2023-08-11 MED ORDER — GLUCOSE MONITORING KIT
PACK | Freq: Four times a day (QID) | 0 refills | Status: DC
Start: 2023-08-11 — End: 2023-11-25

## 2023-08-11 MED ORDER — ALCOHOL PREP 70 % PADS
70 % | Freq: Four times a day (QID) | 8 refills | Status: DC
Start: 2023-08-11 — End: 2023-11-25

## 2023-08-11 MED ORDER — BLOOD GLUCOSE TEST VI STRP
ORAL_STRIP | 8 refills | Status: DC
Start: 2023-08-11 — End: 2023-10-07

## 2023-08-11 MED ORDER — FREESTYLE LANCETS MISC
Freq: Four times a day (QID) | 8 refills | Status: DC
Start: 2023-08-11 — End: 2023-11-25

## 2023-08-11 NOTE — Telephone Encounter (Signed)
 [facility] OB/GYN Result Note Patient Contact Communication   [address] [place], [place] [address]      08/11/23   4:14 PM     Patients Name: Heather Huerta   Medical Record Number: [id number]   Contact Serial Number: [id number]  Date of Birth

## 2023-08-11 NOTE — Telephone Encounter (Signed)
-----   Message from [facility], APRN - CNP sent at 08/11/2023  3:57 PM EST -----  3 abnormal values on 3 hour GTT.  Please refer patient to MFM for diabetic education.  Please send in glucose supplies to pharmacy of choice.

## 2023-08-13 ENCOUNTER — Encounter
Payer: PRIVATE HEALTH INSURANCE | Attending: Women's Health | Primary: Student in an Organized Health Care Education/Training Program

## 2023-08-19 ENCOUNTER — Inpatient Hospital Stay
Admit: 2023-08-19 | Discharge: 2023-08-19 | Disposition: A | Discharge status: Home / Self Care | Payer: PRIVATE HEALTH INSURANCE | Source: Ambulatory Visit | Admitting: Obstetrics & Gynecology | Primary: Student in an Organized Health Care Education/Training Program

## 2023-08-19 DIAGNOSIS — R7309 Other abnormal glucose: Secondary | ICD-10-CM

## 2023-08-19 NOTE — Progress Notes (Signed)
 Gestational Diabetes Mellitus (GDM) and Post-Partum Diabetes Risk Progress Note:  GDM Screening Flow sheet     Patient, Heather Huerta, here for diabetes self-management education visit.  Today's visit was in an individual setting.    MEDICAL HIS  No past m

## 2023-08-20 ENCOUNTER — Encounter
Admit: 2023-08-20 | Discharge: 2023-08-20 | Payer: PRIVATE HEALTH INSURANCE | Attending: Women's Health | Admitting: Women's Health | Primary: Student in an Organized Health Care Education/Training Program

## 2023-08-20 VITALS — BP 122/78 | HR 92 | Wt 221.0 lb

## 2023-08-20 DIAGNOSIS — O09529 Supervision of elderly multigravida, unspecified trimester: Principal | ICD-10-CM

## 2023-08-20 NOTE — Progress Notes (Signed)
.  mbobn        Heather Huerta is a 42 y.o. female [redacted]w[redacted]d    [phone]    OB History   Gravida Para Term Preterm AB Living   5 3 3   1 3    SAB IAB Ectopic Molar Multiple Live Births   1         3      # Outcome Date GA Lbr Len/2nd Weight Sex Type Anes PTL Lv

## 2023-08-22 NOTE — Other (Signed)
 1907-Patient arrived via w/c to L&D room RR3 with c/o decreased fetal movement "all day".  Stated felt some movement on drive to hospital. Denies leaking of fluid of vaginal bleeding. 1912- Fetal monitor applied.

## 2023-08-22 NOTE — H&P (Signed)
 OBSTETRICAL HISTORY AND PHYSICAL  Wendall Mola    Date: 08/22/2023       Time: 9:34 PM   Patient Name: Heather Huerta     Patient DOB: 02-14-1981  Room/Bed: REC3/REC3-01    Admission Date/Time: 08/22/2023  7:07 PM      CC: Decreased Fetal

## 2023-08-23 ENCOUNTER — Inpatient Hospital Stay: Payer: PRIVATE HEALTH INSURANCE | Attending: Obstetrics & Gynecology

## 2023-08-23 MED ORDER — ACETAMINOPHEN 500 MG PO TABS
500 | Freq: Four times a day (QID) | ORAL | Status: DC | PRN
Start: 2023-08-23 — End: 2023-08-22

## 2023-08-23 MED ORDER — ONDANSETRON HCL 4 MG/2ML IJ SOLN
4 | Freq: Four times a day (QID) | INTRAMUSCULAR | Status: DC | PRN
Start: 2023-08-23 — End: 2023-08-22

## 2023-08-23 MED ORDER — ONDANSETRON 4 MG PO TBDP
4 | Freq: Three times a day (TID) | ORAL | Status: DC | PRN
Start: 2023-08-23 — End: 2023-08-22

## 2023-08-23 NOTE — Telephone Encounter (Signed)
 Writer called patient back regarding sleep study order. Patient states having troubles with scheduling due to told by sleep center expiration of office note, unable to schedule sleep study at this time. Writer informed patient will call sleep center and ca

## 2023-08-23 NOTE — Telephone Encounter (Signed)
 Pt called needing to speak with dr Willette Pa regarding issue with sleep study order please call pt at phone number [phone]

## 2023-08-23 NOTE — Telephone Encounter (Signed)
 Writer called patient back informing her sleep study is valid for 12 months, but office note written by ordering provider for sleep study only valid for 6 months per sleep center. Writer also notified patient Clinical research associate will follow up with Dr. Willette Pa regardin

## 2023-08-23 NOTE — Telephone Encounter (Signed)
 Writer called [employer] to inquire about scheduling sleep study for patient. Writer informed by office member sleep study valid for 12 months, but office note by ordering physician only valid for 6 months in order for patient to schedule sleep stu

## 2023-08-24 NOTE — Telephone Encounter (Signed)
 Writer called patient back reporting need for office visit per Dr. Eber Goldsmith. Writer confirmed with patient office visit scheduled for 08/26/22 at 8:20 a.m., okay to double book per Dr. Eber Goldsmith. Patient voiced understanding, no additional needs.

## 2023-08-25 NOTE — L&D Delivery Note (Signed)
Heather Huerta, Heather Huerta [[id number]]      Labor Events    Preterm Labor: Yes  Antenatal Steroids: None  Cervical Ripening Date/Time:      Antibiotics Received during Labor: Yes  Rupture Date/Time:  10/06/23 09:07:00   Rupture Type: AROM  Fluid Color: Clear  Fluid Odor: None  Fluid Volume: Scant  Induction: Cervical Ripening Balloon, Misoprostol, Oxytocin  Labor Complications: None       Anesthesia    Method: Epidural       Delivery Details      Delivery Date: 10/06/23 Delivery Time: 11:12:00   Delivery Type: Vaginal, Spontaneous              Newborn Presentation    Presentation: Vertex  _: Occiput  _: Posterior       Shoulder Dystocia    Shoulder Dystocia Present?: No       Assisted Delivery Details    Forceps Attempted?: No  Vacuum Extractor Attempted?: No                           Cord    Vessels: 3 Vessels  Complications: Nuchal Loose, Knot  Delayed Cord Clamping?: Yes  Cord Clamped Date/Time: 10/06/2023 11:13:00  Cord Blood Disposition: Discard  Gases Sent?: Yes   Cord Comments:  PROCEDURAL - OBSTETRIC - CORD OBSERVATION   nucal X2 w/ true knot             Placenta    Date/Time: 10/06/2023 11:20:00  Removal: Spontaneous  Appearance: Intact  Disposition: Pathology       Lacerations    Episiotomy: None  Perineal Lacerations: None  Other Lacerations: no non-perineal laceration  Number of Repair Packets: 1       Vaginal Counts    Initial Count Personnel: MCGREW RN  Initial Count Verified By: DR [email or web address]  Intial Sponge Count: Correct Intial Needles Count: Correct Intial Instruments Count: Correct   Final Count Personnel: MCGREW  Final Count Verified By: IONGEX  Accurate Final Count?: Yes       Blood Loss  Mother: Heather Huerta #[id number]     Start of Mother's Information      Delivery Blood Loss   Intrapartum & Postpartum: 10/05/23 2312 - 10/06/23 1144    Delivery Admission: 10/05/23 2032 - 10/06/23 1144         Intrapartum & Postpartum Delivery Admission    None                  End of Mother's Information  Mother: Heather Huerta  #[id number]                Delivery Providers    Delivering clinician: Para Skeans, DO     Provider Role    Para Skeans, DO Obstetrician    Kennedy Bucker, RN Primary Nurse     Primary Newborn Nurse     NICU Nurse     Neonatologist     Anesthesiologist     Nurse Anesthetist     Nurse Practitioner     Midwife     Nursery Nurse     Respiratory Therapist     Scrub Tech     Assistant Surgeon    Ephriam Knuckles, MD Resident              Newborn Assessment    Living Status: Living  Delivery Location Comment: 706        Skin Color:  Heart Rate:   Reflex Irritability:   Muscle Tone:   Respiratory Effort:   Total:            1 Minute:    0    2    2    2    2    8         5  Minute:    1    2    2    2    2    9                                         Apgars Assigned By: Maudie Mercury R.N.              Resuscitation    Method: Bulb Suction, Room Air, Stimulation             Newborn Measurements                   Department of Obstetrics and Gynecology  Spontaneous Vaginal Delivery Note        Patient Name: Heather Huerta  Patient DOB: 1980-11-09  Room/Bed: 0706/0706-01  Admission Date/Time: 10/05/2023  8:32 PM  MRN #: [id number]  CSN #: [id number]          Date: 10/06/2023  Time: 11:44 AM    Pre-operative Diagnosis:   Heather Huerta is a 43 y.o. 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   Increased risk of Down Syndrome - the most common chromosomal birth defect (chromosomes - cells that carry genes and transmit heredity information)   Increased risk of miscarriage   20% increase at ages 40 to 4   35% increase at ages 22-44   Over 50% increase by age 19  Increased risk of Cesarean Section (C-Section) for delivery method   Gestational diabetes - diabetes that develops for the first time during pregnancy. Women who have this could possibly have a very large baby who then is at risk for injuries during delivery.   Pregnancy Induced Hypertension - High blood pressure   Placental  Problems - one of the most common placental problems is placenta previa in which the placenta covers all or part of the uterine opening (cervix). This can cause severe bleeding during delivery and can make C-section delivery necessary.  Even with all of these increased risks, with the advancement in medical procedures and testing, there are several ways to reduce your risk. They include early and regular prenatal care, taking the multivitamin with folic acid prescribed by your health care manager, beginning pregnancy at a healthy weight, not smoking or drinking alcoholic beverages, and eating healthy foods.   There are tests that all pregnant women are encouraged to take, but there is additional prenatal testing that is regularly offered to any woman 8 or older because of the potential of increased risks to the mother and baby. These tests are chorionic villus sampling (CVS) and amniocentesis. NIPT is also available which is a non-invasive option for fetal  karyotyping.  With CVS, a small sample of cells (called chorionic villi) is taken from the placenta where it attached to the wall of the uterus. Chorionic villi are tiny parts of the placenta; therefore they have the same genes as the baby. This test is 98% accurate, but can carry a slightly higher risk of miscarriage than amniocentesis, since the procedure is done in early pregnancy.   Amniocentesis is a procedure where a sample of fluid is removed from the amniotic sac for analysis and evaluation. During this procedure, fluid is removed by placing a long needle through the abdominal wall into the amniotic sac. The amniocentesis needle is typically guided into the sac with the help of ultrasound imaging. Once the needle is in the sac, a syringe is used to withdraw the clear amber-colored amniotic fluid, which looks a bit like urine.  NIPT, utilizes the maternal blood to test for fetal cells. These fetal cells are then karyotyped for genetic evaluation.  All of these tests, CVS, NIPT and amniocentesis, let couples know if the fetus will have genetic abnormalities, and will help them make informed decisions regarding their pregnancy. Karyotyping by either CVS, NIPT or Amniocentesis is the ONLY way to confirm the genetic chromosomal structure regarding trisomy; Down's Syndrome, Edward's or Pateau's. TOP [facility] [place] was reviewed. If NIPT is positive then a confirmatory amniocentesis would be recommended to confirm the diagnosis.  TOPSTOH guidelines were reviewed.        Asymptomatic bacteriuria during pregnancy 02/24/2023    Nasal polyp 10/17/2022             Post-operative Diagnosis:    Living newborn infant(s) and Female  Same as Pre-Op      Anesthesia:  epidural anesthesia    EBL: see QBL ml      Findings Summary:  Delivery Summary:  Heather Huerta, Heather Huerta [[id number]]      Labor Events    Preterm Labor: Yes  Antenatal Steroids: None  Cervical Ripening Date/Time:      Antibiotics Received during Labor: Yes  Rupture Date/Time:   10/06/23 09:07:00   Rupture Type: AROM  Fluid Color: Clear  Fluid Odor: None  Fluid Volume: Scant  Induction: Cervical Ripening Balloon, Misoprostol, Oxytocin  Labor Complications: None       Anesthesia    Method: Epidural       Delivery Details      Delivery Date: 10/06/23 Delivery Time: 11:12:00   Delivery Type: Vaginal, Spontaneous              Newborn Presentation    Presentation:  Vertex  _: Occiput  _: Posterior       Shoulder Dystocia    Shoulder Dystocia Present?: No       Assisted Delivery Details    Forceps Attempted?: No  Vacuum Extractor Attempted?: No                           Cord    Vessels: 3 Vessels  Complications: Nuchal Loose, Knot  Delayed Cord Clamping?: Yes  Cord Clamped Date/Time: 10/06/2023 11:13:00  Cord Blood Disposition: Discard  Gases Sent?: Yes   Cord Comments:  PROCEDURAL - OBSTETRIC - CORD OBSERVATION   nucal X2 w/ true knot             Placenta    Date/Time: 10/06/2023 11:20:00  Removal: Spontaneous  Appearance: Intact  Disposition: Pathology       Lacerations    Episiotomy: None  Perineal Lacerations: None  Other Lacerations: no non-perineal laceration  Number of Repair Packets: 1       Vaginal Counts    Initial Count Personnel: MCGREW RN  Initial Count Verified By: DR IONGEX  Intial Sponge Count: Correct Intial Needles Count: Correct Intial Instruments Count: Correct   Final Count Personnel: MCGREW  Final Count Verified By: [email or web address]  Accurate Final Count?: Yes       Blood Loss  Mother: Karess, Harner #[id number]     Start of Mother's Information      Delivery Blood Loss   Intrapartum & Postpartum: 10/05/23 2312 - 10/06/23 1144    Delivery Admission: 10/05/23 2032 - 10/06/23 1144         Intrapartum & Postpartum Delivery Admission    None                  End of Mother's Information  Mother: Halia, Franey #[id number]                Delivery Providers    Delivering clinician: Para Skeans, DO     Provider Role    Para Skeans, DO Obstetrician    Kennedy Bucker, RN Primary  Nurse     Primary Newborn Nurse     NICU Nurse     Neonatologist     Anesthesiologist     Nurse Anesthetist     Nurse Practitioner     Midwife     Nursery Nurse     Respiratory Therapist     Scrub Tech     Assistant Surgeon    Ephriam Knuckles, MD Resident              Newborn Assessment    Living Status: Living  Delivery Location Comment: 706        Skin Color:   Heart Rate:   Reflex Irritability:   Muscle Tone:   Respiratory Effort:   Total:            1 Minute:    0    2    2    2    2    8         5  Minute:    1    2    2    2    2     9  Apgars Assigned By: Maudie Mercury R.N.              Resuscitation    Method: Bulb Suction, Room Air, Stimulation             Newborn Measurements                     Living newborn infant(s) and Female    Cephalic  occiput posterior  Other:       Amniotic Fluid was: Clear  A Nuchal Cord: was present and reduced x 2 w/ true knot  A Spontaneous Cry Was Noted: Yes  The Baby: was suctioned        The Placenta Was Removed:  intact, whole, and that the umbilical cord had three vessels noted    cord gasses were obtained and sent to the lab, cord blood was obtained and sent to the lab, and Pitocin, 20 milliunits in 1 liter of ringers lactate was administered, wide open, to assist with uterine contraction    The umbilical cord had delayed clamping of 1 minute: Yes      Episiotomy: (none)  Second degree laceration.  Suture used for repair:  Vicryl 3.0.  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 Good sphincter tone was present.   Yes      Condition:  infant stable to general nursery and mother stable    Blood Type and Rh:   ABO/Rh   Date Value Ref Range Status   10/05/2023 B POSITIVE  Final         Rubella Immunity Status:     Rubella Antibody, IgG   Date Value Ref Range Status   04/08/2023 24.2 IU/mL Final     Comment:            <10 NON REACTIVE Negative for Anti-Rubella IgG  >=10 REACTIVE Positive for Anti Rubella IgG  The presence of IgG antibody to Rubella virus is an indication of previous exposure either   by prior infection or vaccination.               Infant Feeding:    breast          Attending Attestation: I was present and scrubbed for the entire procedure.      A Vaginal Vault Sweep Was Completed-All Sponge Counts Were Correct: Yes          Resident Name: R. Suppan M.D. PGY3    Attending Name: Para Skeans, DO      Electronically signed by Para Skeans, DO on 10/06/2023 at 11:44 AM

## 2023-08-27 ENCOUNTER — Encounter
Admit: 2023-08-27 | Discharge: 2023-08-28 | Disposition: A | Discharge status: Home / Self Care | Payer: PRIVATE HEALTH INSURANCE | Source: Ambulatory Visit | Attending: Obstetrics & Gynecology | Admitting: Obstetrics & Gynecology | Primary: Student in an Organized Health Care Education/Training Program

## 2023-08-27 ENCOUNTER — Ambulatory Visit
Admit: 2023-08-27 | Discharge: 2023-08-27 | Payer: PRIVATE HEALTH INSURANCE | Attending: Student in an Organized Health Care Education/Training Program | Admitting: Student in an Organized Health Care Education/Training Program | Primary: Student in an Organized Health Care Education/Training Program

## 2023-08-27 VITALS — BP 89/59 | HR 100 | Temp 96.90000°F | Ht 70.0 in | Wt 221.0 lb

## 2023-08-27 DIAGNOSIS — O24410 Gestational diabetes mellitus in pregnancy, diet controlled: Secondary | ICD-10-CM

## 2023-08-27 DIAGNOSIS — G4730 Sleep apnea, unspecified: Principal | ICD-10-CM

## 2023-08-27 NOTE — Progress Notes (Signed)
 Gestational Diabetes Mellitus (GDM) and Post-Partum Diabetes Risk Progress Note:  GDM Screening Flow sheet     Patient, Heather Huerta, here for diabetes self-management education visit.  Today's visit was in an individual setting.    MEDICAL HIS  No past medical history on file.  Family History   Problem Relation Age of Onset    Heart Attack Mother     High Cholesterol Brother      Seasonal   Immunization History   Administered Date(s) Administered    COVID-19, PFIZER PURPLE top, DILUTE for use, (age 63 y+), 58mcg/0.3mL 08/11/2019, 09/01/2019, 09/05/2020    Influenza Virus Vaccine 06/30/2018, 07/15/2020     Current Medications  Current Outpatient Medications   Medication Sig Dispense Refill    Alcohol  Swabs (ALCOHOL  PREP) 70 % PADS 1 each by Does not apply route in the morning, at noon, in the evening, and at bedtime 120 each 8    blood glucose monitor strips Test 4 times a day & as needed for symptoms of irregular blood glucose. Dispense sufficient amount for indicated testing frequency plus additional to accommodate PRN testing needs. 120 strip 8    glucose monitoring kit 1 kit by Does not apply route in the morning, at noon, in the evening, and at bedtime 1 kit 0    FreeStyle Lancets MISC 1 each by Does not apply route 4 times daily 120 each 8    famotidine  (PEPCID ) 20 MG tablet Take 1 tablet by mouth 2 times daily 60 tablet 3    aspirin  (ASPIRIN  CHILDRENS) 81 MG chewable tablet Take 1 tablet by mouth daily 30 tablet 8    Prenatal Vit-Fe Fumarate-FA (PRENATAL VITAMIN) 27-0.8 MG TABS Take 1 tablet by mouth daily 30 tablet 11     No current facility-administered medications for this encounter.   :     Comments:  Allergies:    Allergies   Allergen Reactions    Seasonal        EDC:  Estimated Date of Delivery: 10/13/23                                                                                                                    Assessment Key:  1=Patient selected area for education / decision aide                 NC=Not Covered   N/A=Not Applicable  Topics/ Objectives  GDM in Pregnancy INITIAL  GDM   Instruction    Date & provider initials Reinforce Date Topics/Objectives  GDM Postpartum Care: Future Risk Post Partum Patient Decision Aide and Instruction    Date & provider initials     Reinforce Date Comments   Gestational diabetes disease process: Define gestational diabetes, role of the placenta, risk factors, diagnostic criteria, possible signs and symptoms, and treatment         12-26-24BB  Disease Process: Link between GDM and type 2 diabetes, define what is prediabetes, type 2 diabetes,  Risk for GDM in Subsequent  pregnancies   GDM is new  Along with her PCP, they have been watching her A1c. Last A1c was 5.7%   Healthy Eating: Describe rationale for eating smaller, more frequent meals, why not to skip meals, choose foods low in simple sugar and high in fiber, consume smaller portions of starch foods, eat a protein source with every meal and snack and eat less CHO at breakfast than lunch or supper           12-26-24BB 08/27/23 JW pt has been cutting back on cho's (at low end of recommended and making mindful food choices. Having bs elevations despite efforts. Pt states is used to various diets with trying to lose weight. Makes cultural food choices- is from [place]. Suggested   Br 1-2 cho  Snacks 1 cho  LandS 3-4 cho and to include protein source at every time point. Healthy Eating Postpartum:  -describe Healthy eating post pregnancy to prevent diabetes and support health, or weight, and/or breastfeeding goals - choose foods low in simple sugar and high in fiber, lean proteins, limit sat and trans fat, refer to What to Expect After GDM document 10/2021   Likes naan/rice with meals.  Trying in limit portion of starch and increase veggie/protein    Drinks mostly water     Mostly home cooked meals   Healthy Eating: Tools to implement self-care plan - Correctly read food labels & demonstrate CHO counting & portion control with  personalized meal plan. Identify dining out strategies, food safety/foods to avoid and tips for handling morning sickness.       12-26-24BB 08/27/23 JW total vs type cho Healthy Eating: Tools to implement self-care plan - Correctly read food labels & use of portion control with personalized meal plan.  Identify dining out strategies,   nutrition to help with breastfeeding      Being Active: Verbalize effect of exercise on blood glucose levels; benefits of regular exercise; safety considerations; contraindications; maintenance of activity.                 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    Monitoring:  Discuss rationale for blood glucose testing; testing supplies needed, when to test, importance of logging blood glucose levels for pattern recognition, recommended targets for pregnancy, factors affecting blood glucose, when to share results with MD, and safe lancet disposal.     12-26-24BB 08/27/23 JW having some elevated fbs and pp. Was going through more strips with more frequent testing. Gave suggestions. Monitoring Post Partum describe how often to check your blood sugar after Pregnancy  -describe when to have follow up oral glucose tolerance test, baby BG testing, lifelong screening every 1-3 yrs.    Using record book that came with her Freestyle meter   Taking Medication: Discuss rationale for use of insulin , types of insulin  and delivery, appropriate injection sites; proper storage; supplies needed; proper technique; safe needle disposal guidelines. 12-26-24BB  Post Partum   Taking Medications:  - Discuss rationale need and or stopping glucose lowering medications / insulin .  -verbalize role of metformin  for pre - diabetes/ lowering insulin  resistance   Was  willing  to learn about insulin  in case it will be needed   Coping: Address feelings about diagnosis of GDM, describe how stress, depression & anxiety affect  blood glucose; identify coping strategies; Identify support needed & support network available.     12-26-24BB  Coping Post Partum  -describe the support system you will have in place after delivery  -describe risk for, symptoms of, and steps to get help baby blues"/postpartum depression                    Problem Solving - Prevention, detection & treatment of acute complications:  Identify symptoms of hyper & hypoglycemia, and prevention & treatment strategies   12-26-24BB  Problem Solving - postpartum - describe what to do if blood sugar is higher or lower than what it should be after delivery      Prevention, detection & treatment of potential complications:  Define the effects of the placental hormones on the course of gestational diabetes & describe the relationship of blood glucose levels to health of mom and baby 12-26-24BB  Reducing risks- weight management  -describe how body weight impact blood sugar, risk for diabetes and overall health         Reducing risks- routine care  -describe importance of having a PCP, other than women's health team, -describe how often to see PCP  -describe the tests, if any that have been recommended by health care team / pcp  -describe ways to help you to remember to get recommended tests, describe any barriers at might prevent you from following up with [facility] in the future         Reducing risks- tobacco cessation   -describe the effects of tobacco on your blood sugar  -describe the effects of tobacco on overall health, -describe the benefits of stopping tobacco         Reducing risks- breastfeeding   -describe the effects of breast feeding on blood sugar, describe recommended length of time for breast feeding, describe the resources in community that will help you with your plan to breast feed                                   Reducing risks- birth spacing- describe goal for future pregnancies  -describe how long you want to wait before you become pregnant again, describe why important to have a women's health care provider that you can discuss plan with        Goals     GDM Goals:   Healthy eating - include protein with BK  2.   Healthy eating - watch portion size of bread/rice/carb     Goals     Post Partum Goals:  1.  2.     Plan  -   Follow-up Appointments planned :  with dietitian next week    Counseling framework and strategy used at this session included:         ____  Lecture/Presentation    __X__ Handouts, graphics, or print materials - emailed and mailed    __X__ Demonstration    ____ Discussion with Option/Decision Talk - Postpartum Decision Aid   what was decided, if anything?     ____ Other         Education Materials/ Teaching points (Via US  Mail and or  My Chart  for virtual visits):       GDM visit #1 - RN Introduction to Gestational Diabetes.     Booklet - Gestational Diabetes Mellitus (GDM) toolkit  Booklet - What to Expect after Gestational Diabetes  ( page  1- 8)     Handout - Gestational diabetes handout from [facility] jan 2016, Did you have gestational diabetes when you were pregnant? Handout from [facility]  April 2014, 4 mm pen needle - BD guide to use insulin  pens 2021, 6 mm insulin  syringe - BD guide how to use insulin  syringes - 2019    Online resources:  How to Measure Your Blood Sugar - [facility] Patient Education https://youtu.be/nxIJeHWlhF4  BD Diabetes Care: How to Inject Insulin  with a Pen Needle https://youtu.be/GHVtiJ9xd6A  Diabetes Care: How to Inject Insulin  with a Syringe https://youtu.be/9uSSBu-5eSY       GDM visit #2 - RD Healthy Eating and Meal planning. 08/27/23 JW  Booklet - Gestational Diabetes Mellitus (GDM) toolkit  Booklet - What to Expect after Gestational Diabetes pages 8 and 9 Option Talk / Decision Talk worksheets     Handouts: Simple Guide for [employer], Supermarket  Savvy, Actuary, Contractor, & [employer] out and [employer] &  Fast food guide booklet (if available)    Online resources:  Healthy Eating with Diabetes- [employer] of Diabetes and Digestive and Kidney  https://youtu.be/ct3xk0Od6O8    GDM visit #3  and # 4 - RN or RD Post-partum risk reduction planning and goal setting.   Booklet - Gestational Diabetes Mellitus (GDM) toolkit  Booklet - What to Expect after Gestational Diabetes - review you Patient Decision Aide for self-selected topics of interest and goal setting -  review of community resource guide     Encounter Type  Date  Start Time  End Time  Comments    GDM - visit #1   12-26-24BB     11:30am  12:30pm    MY CHART virtual   GDM- visit #2   08/27/23 JW  11:30am  12:30   MY CHART virtual      GDM- visit #3               Postpartum   GDM -visit #4                Coordination of care- Psychosocial/Community Support:   Recommended[X]    Program or Resource  Verbal Permission to Send Referral (Date XX/XX/XXX)    (if applicable)       [facility]          SNAP          Food Pantry Information          Transportation Assistance           Lactation Consultant, [place]. [facility], [phone]          [facility] Breastfeeding Program [phone]          [facility] & Breastfeeding [place], [phone]          [facility] Link or [facility],  [place] [address]     PCP as needed for routine physical or mental healthcare         Mom's Quit for Two (tobacco/vaping cessation), [phone])[phone]          1-800-Quit Now or 920-716-6384 or visit [place] .quitlogix.org          This is Quitting (vaping cessation) text "DITCHJUUL" TO ([phone]          Mother and Child Dependency Program, 4327292799          [facility], [phone]          Healthy Connections Help Me Grow, (609) 229-3438 or (206)098-2014 or Early Head  Start, [phone]          Pathways Program, [phone]          [facility], [phone]          Behavioral and mental health services. Check service providers within insurance network via member services # on insurance card.         [employer], [phone]          Diabetes Prevention Program: [employer], [place] [employer]         Internet websites - ADAWeb site: www.diabetes.org and www.wirelesssleep.no, www.countthekicks.org, www.text4baby.org          Other :           Erminio Ceo, RN

## 2023-08-27 NOTE — Progress Notes (Signed)
 Heather Huerta is here today, they are being seen for   Chief Complaint   Patient presents with    Follow-up       Tobacco/vape use: YES/NO: no    Alcohol  use: YES/NO: no    Hospitalizations: see EPIC documentation    Prior Surgeries: see EPIC documentation    Medications & Herbal Supplements: see EPIC documentation        Patient here for follow up. Patient needs an updated office note prior to scheduling her sleep study. Patient states she informed the sleep center she is pregnant and they told her it would be fine to do the sleep study while pregnant. Patient would like to get the sleep study done prior to delivery because she said it would be difficult to care for the baby and be away from baby for the sleep study.

## 2023-08-27 NOTE — Progress Notes (Signed)
 [facility] @ [address]. [place]  Follow Up Visit Note    Reason for follow up: sleep disordered breathing    Interval History (08/27/23): No changes since last visit.  No other concerns today    Continues to complain of snoring, nocturnal awakenings, possible witnessed apneic episodes, morning headaches, decreased attention, etc  -------------------------------------------------------------------------------------------       HPI:  Ms Rielly Brunn is a 43 y.o. female presents for evaluation of snoring. Patient states she has been snoring for years now, and that she has not noticed it worsening. States she has associated restless sleep and mouth breathing, but has not noticed any apneic episodes. Also reports she notices she is mouth breathing throughout the day and that her left nostril seems more clogged than her right. Has not had a previous sleep study. Patient reports she has seasonal allergies and uses oral antihistamines along with Flonase. She has not previously been allergy tested.       Current Outpatient Medications on File Prior to Visit   Medication Sig Dispense Refill    Alcohol  Swabs (ALCOHOL  PREP) 70 % PADS 1 each by Does not apply route in the morning, at noon, in the evening, and at bedtime 120 each 8    blood glucose monitor strips Test 4 times a day & as needed for symptoms of irregular blood glucose. Dispense sufficient amount for indicated testing frequency plus additional to accommodate PRN testing needs. 120 strip 8    glucose monitoring kit 1 kit by Does not apply route in the morning, at noon, in the evening, and at bedtime 1 kit 0    FreeStyle Lancets MISC 1 each by Does not apply route 4 times daily 120 each 8    famotidine  (PEPCID ) 20 MG tablet Take 1 tablet by mouth 2 times daily 60 tablet 3    aspirin  (ASPIRIN  CHILDRENS) 81 MG chewable tablet Take 1 tablet by mouth daily 30 tablet 8    Prenatal Vit-Fe Fumarate-FA (PRENATAL VITAMIN) 27-0.8 MG TABS Take  1 tablet by mouth daily 30 tablet 11     No current facility-administered medications on file prior to visit.       Allergies   Allergen Reactions    Seasonal        No past medical history on file.    Past Surgical History:   Procedure Laterality Date    INTRAUTERINE DEVICE INSERTION N/A 08/09/2018    MIrena  insertion NDC [id number]       Family History   Problem Relation Age of Onset    Heart Attack Mother     High Cholesterol Brother        Social History     Tobacco Use    Smoking status: Never     Passive exposure: Never    Smokeless tobacco: Never   Vaping Use    Vaping status: Never Used   Substance Use Topics    Alcohol  use: Not Currently    Drug use: Never       Physical Exam:  Vitals:    08/27/23 0823   BP: (!) 89/59   Pulse: 100   Temp: 96.9 F (36.1 C)   SpO2: 99%      GEN - NAD  HEAD - Normocephalic, atraumatic.  FACE - Symmetric, sinuses non-tender to palpation.   EYES -  EOMI  EARS: Externally normal   NOSE - The nares are patent. No rhinorrhea.  Septum is midline with small  nonobstructive bilateral anterior septal spurs.  OCOP -without concerning masses or lesions  NECK - Supple. Trachea is midline. No palpable LAD. No palpable masses bilaterally.   RESP - Non-labored. No stridor.  Voice is strong.    Assessment/Plan  Ms. Rumor Corso is a 43 y.o. female as above with complaints of snoring and sleep disordered breathing    - Obtain sleep study.  Will call patient with results of sleep study  - Continue using flonase and oral antihistamines.  - Begin to use nasal saline irrigation.  NeilMed sample bottle with instructions on use given to patient today.  - RTC if concerns arise sooner.     This note was created using voice-to-text dictation.  Although the note was reviewed by the author, please excuse any inaccurate transcriptions that may have been overlooked prior to the signature. Please contact the dino of this note with any questions regarding the transcription.       Ole Fujisawa, MD  Otolaryngology - Head and Neck Surgery  [facility] @ [address] [facility]

## 2023-09-08 ENCOUNTER — Encounter

## 2023-09-10 ENCOUNTER — Inpatient Hospital Stay: Payer: PRIVATE HEALTH INSURANCE | Primary: Student in an Organized Health Care Education/Training Program

## 2023-09-10 ENCOUNTER — Encounter
Payer: PRIVATE HEALTH INSURANCE | Attending: Obstetrics & Gynecology | Primary: Student in an Organized Health Care Education/Training Program

## 2023-09-10 ENCOUNTER — Encounter
Admit: 2023-09-10 | Discharge: 2023-09-10 | Payer: PRIVATE HEALTH INSURANCE | Attending: Family | Primary: Student in an Organized Health Care Education/Training Program

## 2023-09-10 VITALS — BP 120/78 | HR 99 | Wt 221.0 lb

## 2023-09-10 DIAGNOSIS — O09529 Supervision of elderly multigravida, unspecified trimester: Secondary | ICD-10-CM

## 2023-09-10 DIAGNOSIS — N898 Other specified noninflammatory disorders of vagina: Secondary | ICD-10-CM

## 2023-09-10 NOTE — Progress Notes (Signed)
Annett Fabian        Heather Huerta is a 43 y.o. female [redacted]w[redacted]d    [phone]    OB History   Gravida Para Term Preterm AB Living   5 3 3   1 3    SAB IAB Ectopic Molar Multiple Live Births   1         3      # Outcome Date GA Lbr Len/2nd Weight Sex Type Anes PTL Lv   5 Current            4 SAB 05/2021           3 Term 11/17/16    F Vag-Spont   LIV   2 Term 05/04/09    F Vag-Spont   LIV   1 Term 05/02/07    F Vag-Spont   LIV       Vitals  BP: 120/78  Weight - Scale: 100.2 kg (221 lb)  Pulse: 99  Patient Position: Sitting  Albumin: Negative  Glucose: Negative  Fundal Height (cm): 35 cm  Fetal HR: 150  Movement: Present      The patient was seen and evaluated. There was positive fetal movements. No contractions or leakage of fluid. Signs and symptoms of preterm labor as well as labor were reviewed. The S/S of Pre-Eclampsia were reviewed with the patient in detail. She is to report any of these if they occur. She currently denies any of these.    Complaining of increase in vaginal discharge over past couple of weeks. Some days are worse than others.  Denies regular contractions, cramping. Admits to occasional braxton hicks. Denies SROM. + fetal movement.    The patient had her 28 week labs completed.  Hospital Outpatient Visit on 08/11/2023   Component Date Value Ref Range Status    Amount Glucose Given 08/11/2023 hide  g Final    Glucose, Fasting 08/11/2023 156  mg/dL Final    Glucose, GTT - 1 Hour 08/11/2023 184  mg/dL Final    Glucose, GTT - 2 Hour 08/11/2023 171  mg/dL Final    Glucose, GTT - 3 Hour 08/11/2023 88  mg/dL Final    Hemoglobin Q5Z 08/11/2023 5.7  4.0 - 6.0 % Final    Estimated Avg Glucose 08/11/2023 117  mg/dL Final    Comment: The ADA and AACC recommend providing the estimated average glucose result to permit better   patient understanding of their HBA1c result.     ]    Patient is currently enrolled in Maternity Care Management Episode  Start day 910/4/24  Risk for Admission or ED Visit: low  Please see patient  outreach encounters for further details.  For questions contact:   Johnnye Lana, RN MSN/ed Associate Care Manager  [facility]   Cell [phone]  [email or web address]     EDD by TVUS:     High Risk Dx:      PRENATAL LAB RESULTS:   Pre-pregnancy: BMI  Blood Type/Rh: B+  Antibody Screen: neg  Hemoglobin, Hematocrit: 11.6/35.3  Rubella: immune  T. Pallidum, IgG: neg  Hepatitis B Surface Antigen: neg  TSH: 2.36  HIV: neg  Sickle Cell Screen: n/a  Gonorrhea: neg  Chlamydia: neg  Urine culture: neg    Early  hour Glucose Tolerance Test:     28 wk 1 hr GTT 174    Glucose Fasting: 156  1 hour Glucose Tolerance Test: 184  2 hour Glucose Tolerance Test: 171  3 hour Glucose Tolerance  Test: 88    Group B Strep:      Cystic Fibrosis Screen:  neg  First Trimester Screen:  neg  Quad Screen:    Anatomy US:     Tdap: yes  Date given: 09/10/2023  Flu shot:   COVID Shot:     T-Dap Vaccine (27-36 weeks): completed    RSV Vaccine (32-36 weeks): awaiting    The patient was instructed on fetal kick counts and was given a kick sheet to complete every 8 hours. She was instructed that the baby should move at a minimum of ten times within one hour after a meal. The patient was instructed to lay down on her left side twenty minutes after eating and count movements for up to one hour with a target value of ten movements.  She was instructed to notify the office if she did not make that target after two attempts or if after any attempt there was less than four movements.    The patient reports that the targets have been made Yes.    Antenatal Testing: (Antenatal testing time excludes any time spent for the prenatal visit)  Not indicated at present time.    Assessment:  1. Heather Huerta is a 43 y.o. female  2. [id number]  3. 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   Increased risk of Down Syndrome - the most common chromosomal birth defect (chromosomes - cells that carry genes and transmit heredity information)   Increased risk of miscarriage   20% increase at ages 46 to 49   35% increase at ages 74-44   Over 50% increase by age 3  Increased risk of Cesarean Section (C-Section) for delivery method   Gestational diabetes - diabetes that develops for the first time during pregnancy. Women who have this could possibly have a very large baby who then is at risk for injuries during delivery.   Pregnancy Induced Hypertension - High blood pressure   Placental Problems - one of the most common placental problems is placenta previa in which the placenta covers all or part of the uterine opening (cervix). This can cause severe bleeding during delivery and can make C-section delivery necessary.  Even with all of these increased risks, with the advancement in medical procedures and testing, there are several ways to reduce your risk. They include early and regular prenatal care, taking the multivitamin with folic acid prescribed by your health care manager, beginning pregnancy at a healthy weight, not smoking or drinking alcoholic beverages, and eating healthy foods.  There are tests that all pregnant women are encouraged to take, but there is additional prenatal testing that is regularly offered to any woman 40 or older because of  the potential of increased risks to the mother and baby. These tests are chorionic villus sampling (CVS) and amniocentesis. NIPT is also available which is a non-invasive option for fetal karyotyping.  With CVS, a small sample of cells (called chorionic villi) is taken from the placenta where it attached to the wall of the uterus. Chorionic villi are tiny parts of the placenta; therefore they have the same genes as the baby. This test is 98% accurate, but can carry a slightly higher risk of miscarriage than amniocentesis, since the procedure is done in early pregnancy.   Amniocentesis is a procedure where a sample of fluid is removed from the amniotic sac for analysis and evaluation. During this procedure, fluid is removed by placing a long needle through the abdominal wall into the amniotic sac. The amniocentesis needle is typically guided into the sac with the help of ultrasound imaging. Once the needle is in the sac, a syringe is used to withdraw the clear amber-colored amniotic fluid, which looks a bit like urine.  NIPT, utilizes the maternal blood to test for fetal cells. These fetal cells are then karyotyped for genetic evaluation.  All of these tests, CVS, NIPT and amniocentesis, let couples know if the fetus will have genetic abnormalities, and will help them make informed decisions regarding their pregnancy. Karyotyping by either CVS, NIPT or Amniocentesis is the ONLY way to confirm the genetic chromosomal structure regarding trisomy; Down's Syndrome, Edward's or Pateau's. TOP ST OH was reviewed. If NIPT is positive then a confirmatory amniocentesis would be recommended to confirm the diagnosis.  TOPSTOH guidelines were reviewed.        Missed ab 06/27/2021     Priority: Medium    [redacted] weeks gestation of pregnancy 08/22/2023    Asymptomatic bacteriuria during pregnancy 02/24/2023    Vaginal bleeding in pregnancy 02/24/2023    Nasal polyp 10/17/2022        Diagnosis Orders   1. Antepartum multigravida of  advanced maternal age        2. GBS (group B Streptococcus carrier), +RV culture, currently pregnant        3. 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 Abnormal glucose tolerance test (GTT)        5. Diet controlled gestational diabetes mellitus (GDM) in third trimester        6. History of miscarriage        7. Subchorionic hemorrhage of placenta in first trimester (Resolved)        8. [redacted] weeks gestation of pregnancy        9. Vaginal discharge  C.trachomatis N.gonorrhoeae DNA    Vaginitis DNA Probe      10. Vaginal itching  C.trachomatis N.gonorrhoeae DNA    Vaginitis DNA Probe      11. Need for Tdap vaccination  Tdap, BOOSTRIX, (age 62 yrs+), IM          SVE: cervix visually closed. No pooling of fluid noted. Small amount of yellow clumpy discharge noted. Vaginal cultures collected.      Plan:  The patient will return to the office for her next visit in 1 weeks. See antepartum flow sheet.   Vaginal cultures collected.  TDAP vaccine given.    Antenatal Testing Indicated: No  Scheduled with  Nursing-Pt notified: No    The patient, Heather Huerta is a 43 y.o. female, was seen with a total time spent of 20 minutes for the visit on this date of service by the E/M provider Excluding any time spent for antenatal testing. The time component had both face to face and non face to face time spent in determining the total time component.  Counseling and education regarding her diagnosis listed below and her options regarding those diagnoses were also included in determining her time component.      Diagnosis Orders   1. Antepartum multigravida of advanced maternal age        2. GBS (group B Streptococcus carrier), +RV culture, currently pregnant        3. 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 Abnormal glucose tolerance test (GTT)        5. Diet controlled gestational diabetes mellitus (GDM) in third trimester        6. History of miscarriage        7.  Subchorionic hemorrhage of placenta in first trimester (Resolved)        8. [redacted] weeks gestation of pregnancy        9. Vaginal discharge  C.trachomatis N.gonorrhoeae DNA    Vaginitis DNA Probe      10. Vaginal itching  C.trachomatis N.gonorrhoeae DNA    Vaginitis DNA Probe      11. Need for Tdap vaccination  Tdap, BOOSTRIX, (age 48 yrs+), IM           The patient had her preventative health maintenance recommendations and follow-up reviewed with her at the completion of her visit.

## 2023-09-11 LAB — VAGINITIS DNA PROBE
Candida species: POSITIVE — AB
GARDNERELLA VAGINALIS: POSITIVE — AB
Trichomonas: NEGATIVE

## 2023-09-13 LAB — C.TRACHOMATIS N.GONORRHOEAE DNA
C. trachomatis DNA: NEGATIVE
N. gonorrhoeae DNA: NEGATIVE

## 2023-09-13 MED ORDER — MICONAZOLE NITRATE 1200 & 2 MG & % VA KIT
1200 | PACK | VAGINAL | 0 refills | Status: AC
Start: 2023-09-13 — End: 2023-09-20

## 2023-09-13 MED ORDER — METRONIDAZOLE 500 MG PO TABS
500 | ORAL_TABLET | Freq: Two times a day (BID) | ORAL | 0 refills | Status: AC
Start: 2023-09-13 — End: 2023-09-20

## 2023-09-13 NOTE — Telephone Encounter (Signed)
Per Julie NP, left VM for pt to return call to office re: results.

## 2023-09-13 NOTE — Telephone Encounter (Signed)
-----   Message from [facility], APRN - CNP sent at 09/13/2023  8:01 AM EST -----  + BV and candida  OTC monistat, may send script if patient requests.  Flagyl 500mg  PO BID x7 days

## 2023-09-13 NOTE — Telephone Encounter (Signed)
Pt called to confirm viewed results via mychart- and would like Meds sent to [facility] on [address]

## 2023-09-15 NOTE — Telephone Encounter (Signed)
Breast Pump order faxed to [facility] (& confirmation rec'd )

## 2023-09-17 ENCOUNTER — Ambulatory Visit
Admit: 2023-09-17 | Discharge: 2023-09-17 | Payer: PRIVATE HEALTH INSURANCE | Attending: Maternal & Fetal Medicine | Primary: Student in an Organized Health Care Education/Training Program

## 2023-09-17 ENCOUNTER — Encounter
Payer: PRIVATE HEALTH INSURANCE | Attending: Women's Health | Primary: Student in an Organized Health Care Education/Training Program

## 2023-09-17 VITALS — BP 111/62 | HR 96 | Temp 97.20000°F | Resp 16 | Ht 70.0 in | Wt 224.0 lb

## 2023-09-17 DIAGNOSIS — Z3490 Encounter for supervision of normal pregnancy, unspecified, unspecified trimester: Secondary | ICD-10-CM

## 2023-09-17 DIAGNOSIS — O09529 Supervision of elderly multigravida, unspecified trimester: Secondary | ICD-10-CM

## 2023-09-17 NOTE — Progress Notes (Signed)
Please refer to attached ultrasound report for doctor's evaluation of the clinical information obtained by vital signs, ultrasound, and/or non-stress test along with management recommendation.

## 2023-09-17 NOTE — Progress Notes (Signed)
[facility] [place], [facility]  [address] Heather Huerta  [address]  [place] [address]  Dept: [phone]     09/17/2023    RE:  Heather Huerta     DOB: 1980/12/17   AGE: 43 y.o.     Dear Dr. Richrd Prime,    Thank you for allowing me to see Heather Huerta.  As I'm sure you will recall, Heather Huerta is a 43 y.o. [id number]'s last menstrual period was 01/06/2023 (exact date). 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 05/2023 06/14/2023    GBS (group B Streptococcus carrier), +RV culture, currently pregnant 04/05/2023    Missed ab 06/27/2021    Fifth pregnancy 09/17/2023    [redacted] weeks gestation of pregnancy 08/22/2023    Antepartum multigravida of advanced maternal age 48/04/2023    Asymptomatic bacteriuria during pregnancy 02/24/2023    Vaginal bleeding in pregnancy 02/24/2023    Nasal polyp 10/17/2022        PAST HISTORY:  OB History   Gravida Para Term Preterm AB Living   5 3 3   1 3    SAB IAB Ectopic Molar Multiple Live Births   1         3      # Outcome Date GA Lbr Len/2nd Weight Sex Type Anes PTL Lv   5 Current            4 SAB 05/2021           3 Term 11/17/16    F Vag-Spont   LIV   2 Term 05/04/09    F Vag-Spont   LIV   1 Term 05/02/07    F Vag-Spont   LIV          MEDICAL:  No past medical history on file.     SURGICAL:  Past Surgical History:   Procedure Laterality Date    INTRAUTERINE DEVICE INSERTION N/A 08/09/2018    MIrena insertion NDC [id number]       ALLERGIES:    Allergies   Allergen Reactions    Seasonal          MEDICATIONS:    Current Outpatient Medications   Medication Sig Dispense Refill    miconazole (MONISTAT 1  COMBINATION PACK) 1200 & 2 MG & % kit Place vaginally once. 1 kit 0    metroNIDAZOLE (FLAGYL) 500 MG tablet Take 1 tablet by mouth 2 times daily for 7 days 14 tablet 0    Alcohol Swabs (ALCOHOL PREP) 70 % PADS 1 each by Does not apply route in the morning, at noon, in the evening, and at bedtime 120 each 8    blood glucose monitor strips Test 4 times a day & as needed for symptoms of irregular blood glucose. Dispense sufficient amount for indicated testing frequency plus additional to accommodate PRN testing needs. 120 strip 8    glucose monitoring kit 1 kit by Does not apply route in the morning, at noon, in the evening, and at bedtime 1 kit 0    FreeStyle Lancets MISC 1 each by Does not apply route 4 times daily 120 each 8  famotidine (PEPCID) 20 MG tablet Take 1 tablet by mouth 2 times daily 60 tablet 3    aspirin (ASPIRIN CHILDRENS) 81 MG chewable tablet Take 1 tablet by mouth daily 30 tablet 8    Prenatal Vit-Fe Fumarate-FA (PRENATAL VITAMIN) 27-0.8 MG TABS Take 1 tablet by mouth daily 30 tablet 11     No current facility-administered medications for this visit.        Social History     Socioeconomic History    Marital status: Unknown   Tobacco Use    Smoking status: Never     Passive exposure: Never    Smokeless tobacco: Never   Vaping Use    Vaping status: Never Used   Substance and Sexual Activity    Alcohol use: Not Currently    Drug use: Never    Sexual activity: Yes     Partners: Male     Social Determinants of Health     Financial Resource Strain: Low Risk  (10/16/2022)    Overall Financial Resource Strain (CARDIA)     Difficulty of Paying Living Expenses: Not hard at all   Food Insecurity: No Food Insecurity (10/16/2022)    Hunger Vital Sign     Worried About Running Out of Food in the Last Year: Never true     Ran Out of Food in the Last Year: Never true   Transportation Needs: Unknown (10/16/2022)    PRAPARE - Transportation     Lack of Transportation (Non-Medical): No   Housing Stability: Unknown  (10/16/2022)    Housing Stability Vital Sign     Unstable Housing in the Last Year: No          FAMILY MEDICAL HISTORY:   Family History   Problem Relation Age of Onset    Heart Attack Mother     High Cholesterol Brother           PHYSICAL EXAMINATION:  BP 111/62 (Site: Right Upper Arm, Position: Sitting)   Pulse 96   Temp 97.2 F (36.2 C) (Temporal)   Resp 16   Ht 1.778 m (5\' 10" )   Wt 101.6 kg (224 lb)   LMP 01/06/2023 (Exact Date)   Body mass index is 32.14 kg/m.    Urine dipstick:  No results found for this visit on 09/17/23.     A/an growth, BPP ultrasound was done in our office today. Please refer to the enclosed copy of the ultrasound report for further information.    Discussion:  1. Singleton fetus in a Cephalic presentation. Fetal motion and cardiac motion is seen and appears normal.  2. The baby is appropriate size for gestational age and is consistent with LMP dating.  3. Fetal anomalies/findings: At least two (2) suspected fetal cardiac ventricular septal defects (VSDs) again noted. We were not able to complete the repeat anatomical survey today due to maternal body habitus.  4. The placenta is Anterior. The amniotic fluid is normal.  5. Biophysical profile is 8/8.  6, I reviewed her blood sugars.  No changes need to be made in her diabetic regimen.  I think she is doing a very good job managing her blood sugars.    IMPRESSION:  1. Single intrauterine gestation at 36+ weeks with appropriate growth, AMA, gestational diabetes.  2.  No obvious fetal anomalies are noted.  The fetus is in a vertex presentation.  3.  The amniotic fluid volume is normal and the placenta is anterior.    RECOMMENDATIONS:  Each of  the recommendations were discussed with the patient:  1.  Continue weekly biophysical profiles.  2.  Continue weekly NSTs in your office.  3.  Continue diabetic management.  4.  Delivery at [redacted] weeks gestation.  5.  Follow-up would be otherwise as clinically indicated.    The patient is to  continue to follow with you in your office for ongoing obstetric care.     PLAN:    As noted above or sooner prn.    Sincerely,        Ladona Mow, MD      I spent 25 minutes of direct contact time with the patient of which greater than 50% of the time was used to counsel the patient, discuss complications and problems related to her pregnancy, or coordinating her care in addition to the time spent interpreting the ultrasound. I answered all of her questions to her satisfaction.

## 2023-09-17 NOTE — Patient Instructions (Signed)
Patient advised to follow up with referring physician.

## 2023-09-24 ENCOUNTER — Ambulatory Visit
Admit: 2023-09-24 | Payer: PRIVATE HEALTH INSURANCE | Attending: Maternal & Fetal Medicine | Primary: Student in an Organized Health Care Education/Training Program

## 2023-09-24 ENCOUNTER — Encounter
Admit: 2023-09-24 | Discharge: 2023-09-24 | Payer: PRIVATE HEALTH INSURANCE | Attending: Obstetrics & Gynecology | Primary: Student in an Organized Health Care Education/Training Program

## 2023-09-24 ENCOUNTER — Inpatient Hospital Stay: Payer: PRIVATE HEALTH INSURANCE | Primary: Student in an Organized Health Care Education/Training Program

## 2023-09-24 VITALS — BP 120/67 | HR 108 | Temp 97.60000°F | Resp 16 | Ht 70.0 in | Wt 218.0 lb

## 2023-09-24 VITALS — BP 118/88 | HR 87 | Wt 219.0 lb

## 2023-09-24 DIAGNOSIS — O09529 Supervision of elderly multigravida, unspecified trimester: Secondary | ICD-10-CM

## 2023-09-24 DIAGNOSIS — Z3A37 37 weeks gestation of pregnancy: Secondary | ICD-10-CM

## 2023-09-24 DIAGNOSIS — Z3490 Encounter for supervision of normal pregnancy, unspecified, unspecified trimester: Secondary | ICD-10-CM

## 2023-09-24 MED ORDER — METFORMIN HCL ER 500 MG PO TB24
500 MG | ORAL_TABLET | Freq: Two times a day (BID) | ORAL | 0 refills | Status: DC
Start: 2023-09-24 — End: 2023-10-07

## 2023-09-24 NOTE — Progress Notes (Signed)
[facility]   [facility], [facility]  [facility]  [address]  [place] [address]  Dept: [phone]     09/24/2023    RE:  Heather Huerta     DOB: Aug 04, 1981   AGE: 43 y.o.     Dear Dr. Richrd Prime,    Thank you for allowing me to see Heather Huerta.  As I'm sure you will recall, Heather Huerta is a 42 y.o. [id number]'s last menstrual period was 01/06/2023 (exact date). 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 05/2023 06/14/2023    GBS (group B Streptococcus carrier), +RV culture, currently pregnant 04/05/2023    Missed ab 06/27/2021    Fifth pregnancy 09/17/2023    [redacted] weeks gestation of pregnancy 08/22/2023    Antepartum multigravida of advanced maternal age 03/02/2023    Asymptomatic bacteriuria during pregnancy 02/24/2023    Vaginal bleeding in pregnancy 02/24/2023    Nasal polyp 10/17/2022        PAST HISTORY:  OB History   Gravida Para Term Preterm AB Living   5 3 3   1 3    SAB IAB Ectopic Molar Multiple Live Births   1         3      # Outcome Date GA Lbr Len/2nd Weight Sex Type Anes PTL Lv   5 Current            4 SAB 05/2021           3 Term 11/17/16    F Vag-Spont   LIV   2 Term 05/04/09    F Vag-Spont   LIV   1 Term 05/02/07    F Vag-Spont   LIV          MEDICAL:  History reviewed. No pertinent past medical history.     SURGICAL:  Past Surgical History:   Procedure Laterality Date    INTRAUTERINE DEVICE INSERTION N/A 08/09/2018    MIrena insertion NDC [id number]       ALLERGIES:    Allergies   Allergen Reactions    Seasonal          MEDICATIONS:    Current Outpatient Medications   Medication Sig  Dispense Refill    Alcohol Swabs (ALCOHOL PREP) 70 % PADS 1 each by Does not apply route in the morning, at noon, in the evening, and at bedtime 120 each 8    blood glucose monitor strips Test 4 times a day & as needed for symptoms of irregular blood glucose. Dispense sufficient amount for indicated testing frequency plus additional to accommodate PRN testing needs. 120 strip 8    glucose monitoring kit 1 kit by Does not apply route in the morning, at noon, in the evening, and at bedtime 1 kit 0    FreeStyle Lancets MISC 1 each by Does not apply route 4 times daily 120 each 8    famotidine (PEPCID) 20 MG tablet Take 1 tablet by mouth 2 times daily 60 tablet 3    aspirin (ASPIRIN CHILDRENS) 81 MG chewable tablet Take 1 tablet by mouth daily  30 tablet 8    Prenatal Vit-Fe Fumarate-FA (PRENATAL VITAMIN) 27-0.8 MG TABS Take 1 tablet by mouth daily 30 tablet 11     No current facility-administered medications for this visit.        Social History     Socioeconomic History    Marital status: Unknown     Spouse name: None    Number of children: None    Years of education: None    Highest education level: None   Tobacco Use    Smoking status: Never     Passive exposure: Never    Smokeless tobacco: Never   Vaping Use    Vaping status: Never Used   Substance and Sexual Activity    Alcohol use: Not Currently    Drug use: Never    Sexual activity: Yes     Partners: Male     Social Determinants of Health     Financial Resource Strain: Low Risk  (10/16/2022)    Overall Financial Resource Strain (CARDIA)     Difficulty of Paying Living Expenses: Not hard at all   Food Insecurity: No Food Insecurity (10/16/2022)    Hunger Vital Sign     Worried About Running Out of Food in the Last Year: Never true     Ran Out of Food in the Last Year: Never true   Transportation Needs: Unknown (10/16/2022)    PRAPARE - Transportation     Lack of Transportation (Non-Medical): No   Housing Stability: Unknown (10/16/2022)    Housing Stability Vital Sign      Unstable Housing in the Last Year: No          FAMILY MEDICAL HISTORY:   Family History   Problem Relation Age of Onset    Heart Attack Mother     High Cholesterol Brother           PHYSICAL EXAMINATION:  BP 120/67   Pulse (!) 108   Temp 97.6 F (36.4 C) (Temporal)   Resp 16   Ht 1.778 m (5\' 10" )   Wt 98.9 kg (218 lb)   LMP 01/06/2023 (Exact Date)   Body mass index is 31.28 kg/m.    Urine dipstick:  No results found for this visit on 09/24/23.     IMPRESSION:  1. Singleton fetus in a Cephalic presentation. Fetal motion and cardiac motion is seen and appears normal.  2. The placenta is Anterior. The amniotic fluid is normal.  3. Fetal anomalies/findings: At least two (2) suspected fetal cardiac ventricular septal defects (VSDs) again noted. We were not able to complete the repeat anatomical survey today due to maternal body habitus.  4. Biophysical profile is 8/8.  5.  I spent a long time talking to the patient about her blood sugar control.  She states that she has changed her diet so much that she is losing weight.  I reassured her that weight loss in pregnancy was not associated with significant compromise to the fetus.  I did talk to her about the fact that her blood sugars were too high.  I offered her metformin for help control her blood sugars and she has agreed to this.  I would have her take one 500 mg tablet at night before bed and I would have her take one 500 mg tablet midmorning to help with lunch and dinner blood sugars.  6.  I talked her about delivery timing and I would recommend delivery at [redacted] weeks gestation.  She does not  have a good medical reason to deliver prior to that point in time.  I also talked to her about performing kick counts.  I stressed the importance of them.  7.  Lastly I encouraged her to go to labor and delivery if she had any concerns about the pregnancy and to not hesitate to go to labor and delivery to be evaluated.  RECOMMENDATIONS:  Each of the recommendations  were discussed with the patient:  1.  Continue weekly biophysical profiles.  2.  Continue weekly nonstress test in your office.  3.  Delivery at [redacted] weeks gestation.  4.  Follow-up would be otherwise as clinically indicated.    The patient is to continue to follow with you in your office for ongoing obstetric care.     PLAN:    As noted above or sooner prn.    Sincerely,        Ladona Mow, MD      I spent 30 minutes of direct contact time with the patient of which greater than 50% of the time was used to counsel the patient, discuss complications and problems related to her pregnancy, or coordinating her care in addition to the time spent interpreting the ultrasound. I answered all of her questions to her satisfaction.

## 2023-09-24 NOTE — Progress Notes (Signed)
 Please refer to attached ultrasound report for doctor's evaluation of the clinical information obtained by vital signs, ultrasound, and/or non-stress test along with management recommendation.

## 2023-09-24 NOTE — Patient Instructions (Signed)
 Patient advised to follow up with referring physician.

## 2023-09-24 NOTE — Progress Notes (Signed)
Aveline Catalina is a 43 y.o. female [redacted]w[redacted]d    [phone]    OB History   Gravida Para Term Preterm AB Living   5 3 3   1 3    SAB IAB Ectopic Molar Multiple Live Births   1         3      # Outcome Date GA Lbr Len/2nd Weight Sex Type Anes PTL Lv   5 Current            4 SAB 05/2021           3 Term 11/17/16    F Vag-Spont   LIV   2 Term 05/04/09    F Vag-Spont   LIV   1 Term 05/02/07    F Vag-Spont   LIV       Vitals  BP: 118/88  Weight - Scale: 99.3 kg (219 lb)  Pulse: 87  Albumin: Trace  Glucose: Negative      The patient was seen and evaluated. There was positive fetal movements. No contractions or leakage of fluid. Signs and symptoms of labor were reviewed.  The S/S of Pre-Eclampsia were reviewed with the patient in detail. She is to report any of these if they occur. She currently denies any of these.    The patient was instructed on fetal kick counts and was given a kick sheet to complete every 8 hours. She was instructed that the baby should move at a minimum of ten times within one hour after a meal. The patient was instructed to lay down on her left side twenty minutes after eating and count movements for up to one hour with a target value of ten movements.  She was instructed to notify the office if she did not make that target after two attempts or if after any attempt there was less than four movements.    The patient reports that the targets have been made Yes.    Previously Patient enrolled in Maternity Care Management Episode      EDD by TVUS:     High Risk Dx:      PRENATAL LAB RESULTS:   Pre-pregnancy: BMI  Blood Type/Rh: B+  Antibody Screen: neg  Hemoglobin, Hematocrit: 11.6/35.3  Rubella: immune  T. Pallidum, IgG: neg  Hepatitis B Surface Antigen: neg  TSH: 2.36  HIV: neg  Sickle Cell Screen: n/a  Gonorrhea: neg  Chlamydia: neg  Urine culture: neg    Early  hour Glucose Tolerance Test:     28 wk 1 hr GTT 174    Glucose Fasting: 156  1 hour Glucose Tolerance Test: 184  2 hour Glucose Tolerance Test:  171  3 hour Glucose Tolerance Test: 88    Group B Strep:      Cystic Fibrosis Screen:  neg  First Trimester Screen:  neg  Quad Screen:    Anatomy US:     Tdap: yes  Date given: 09/10/2023  Flu shot:   COVID Shot:     T-Dap Vaccine Completed (27-36 weeks): Yes    RSV Vaccine (32-36 weeks): awaiting    Allergies:  Allergies as of 09/24/2023 - Fully Reviewed 09/24/2023   Allergen Reaction Noted    Seasonal  08/27/2023         Group Beta Strep collection was completed. Yes  GBS Results:   Hospital Outpatient Visit on 09/10/2023   Component Date Value Ref Range Status    Source 09/10/2023 .VAGINAL SWAB   Final  Trichomonas 09/10/2023 NEGATIVE  NEGATIVE Final    for Trichomonas Vaginalis    GARDNERELLA VAGINALIS 09/10/2023 POSITIVE (A)  NEGATIVE Final    for Gardnerella vaginalis    Candida species 09/10/2023 POSITIVE (A)  NEGATIVE Final    Comment: for Candida sp.  Method of testing is a DNA probe intended for detection and identification of Candida   species, Gardnerella vaginalis, and Trichomonas vaginalis nucleic acid in vaginal fluid   specimens from patients with symptoms of vaginitis/vaginosis.      Specimen Description 09/10/2023 .CERVIX   Final    C. trachomatis DNA 09/10/2023 NEGATIVE  NEGATIVE Final    Comment: CHLAMYDIA TRACHOMATIS DNA not detected by nucleic acid amplification.        This test is intended for medical purposes only and is not valid for the evaluation of   suspected sexual abuse or for other forensic purposes.  In certain contexts, culture may be required to meet applicable laws and regulations for   diagnosis of C. trachomatis and N. gonorrhoeae infections.  Per 2014  CDC recommendations, this test does not include confirmation of positive results   by an alternative nucleic acid target.      N. gonorrhoeae DNA 09/10/2023 NEGATIVE  NEGATIVE Final    Comment: NEISSERIA GONORRHOEAE DNA not detected by nucleic acid amplification.        This test is intended for medical purposes only and is not  valid for the evaluation of   suspected sexual abuse or for other forensic purposes.  In certain contexts, culture may be required to meet applicable laws and regulations for   diagnosis of C. trachomatis and N. gonorrhoeae infections.  Per 2014  CDC recommendations, this test does not include confirmation of positive results   by an alternative nucleic acid target.     ]        Cervical Exam was:   1/60/-1/V/I cm dilated    The literature regarding a questionable link to pitocin augmentation and induction of labor, the assistance of labor contractions and the initiation of contractions to help delivery, have been reviewed with the patient regarding the increased potential of having a newborn with Attention Deficit Hyperactivity Disorder and or Autism. These two disorders and the ramifications of their impact on a child and the family caring for that child has been reviewed with the patient in detail. She was given the risks, benefits and alternatives of the use of this medication. She has agreed to its use in the delivery of her unborn child if needed at the time of delivery, Yes.      The patient was counseled on the mandatory call ahead policy. She has been instructed to call the office at anytime prior to going into the hospital so the on-call physician may direct her to the appropriate facility for care. Exceptions were reviewed including but not limited to: Decreased fetal movement, vaginal Bleeding or hemorrhage, trauma, readily expectant delivery, or any instance where she feels 911 should be utilized.      The patient was counseled on Labor & Delivery.   Route of delivery and counseling on vaginal, operative vaginal, and cesarean sections were completed with the risks of each to both the patient as well as her baby. The possibility of a blood transfusion was discussed as well. The patient was opposed to receiving a transfusion if needed. The patient was counseled on types of analgesia during labor and is  considering either Regional or IV medication the risks, benefits  and alternatives were discussed.     Antenatal Testing:  To be scheduled. Pt was started on metformin today.   The recommendation to proceed to fetal kick counts every 8 hours daily instead of Non stress testing, (as per Boelig RC, Charlesetta Ivory, MFM guidance for Covid-19 testing, American Journal of Obstetrics & Gynecology, [phone])         Assessment:  1. Kaylah Gentzler is a 43 y.o. female  2. [id number]  3. 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   Increased risk of Down Syndrome - the most common chromosomal birth defect (chromosomes - cells that carry genes and transmit heredity information)   Increased risk of miscarriage   20% increase at ages 70 to 70   35% increase at ages 65-44   Over 50% increase by age 68  Increased risk of Cesarean Section (C-Section) for delivery method   Gestational diabetes - diabetes that develops for the first time during pregnancy. Women who have this could possibly have a very large baby who then is at risk for injuries  during delivery.   Pregnancy Induced Hypertension - High blood pressure   Placental Problems - one of the most common placental problems is placenta previa in which the placenta covers all or part of the uterine opening (cervix). This can cause severe bleeding during delivery and can make C-section delivery necessary.  Even with all of these increased risks, with the advancement in medical procedures and testing, there are several ways to reduce your risk. They include early and regular prenatal care, taking the multivitamin with folic acid prescribed by your health care manager, beginning pregnancy at a healthy weight, not smoking or drinking alcoholic beverages, and eating healthy foods.   There are tests that all pregnant women are encouraged to take, but there is additional prenatal testing that is regularly offered to any woman 63 or older because of the potential of increased risks to the mother and baby. These tests are chorionic villus sampling (CVS) and amniocentesis. NIPT is also available which is a non-invasive option for fetal karyotyping.  With CVS, a small sample of cells (called chorionic villi) is taken from the placenta where it attached to the wall of the uterus. Chorionic villi are tiny parts of  the placenta; therefore they have the same genes as the baby. This test is 98% accurate, but can carry a slightly higher risk of miscarriage than amniocentesis, since the procedure is done in early pregnancy.   Amniocentesis is a procedure where a sample of fluid is removed from the amniotic sac for analysis and evaluation. During this procedure, fluid is removed by placing a long needle through the abdominal wall into the amniotic sac. The amniocentesis needle is typically guided into the sac with the help of ultrasound imaging. Once the needle is in the sac, a syringe is used to withdraw the clear amber-colored amniotic fluid, which looks a bit like urine.  NIPT, utilizes the maternal blood to test for  fetal cells. These fetal cells are then karyotyped for genetic evaluation.  All of these tests, CVS, NIPT and amniocentesis, let couples know if the fetus will have genetic abnormalities, and will help them make informed decisions regarding their pregnancy. Karyotyping by either CVS, NIPT or Amniocentesis is the ONLY way to confirm the genetic chromosomal structure regarding trisomy; Down's Syndrome, Edward's or Pateau's. TOP ST [place] was reviewed. If NIPT is positive then a confirmatory amniocentesis would be recommended to confirm the diagnosis.  TOPSTOH guidelines were reviewed.        Asymptomatic bacteriuria during pregnancy 02/24/2023    Vaginal bleeding in pregnancy 02/24/2023    Nasal polyp 10/17/2022        Diagnosis Orders   1. [redacted] weeks gestation of pregnancy  Culture, Strep B Screen, Vaginal/Rectal      2. Antepartum multigravida of advanced maternal age        55. GBS (group B Streptococcus carrier), +RV culture, currently pregnant        4. 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 Abnormal glucose tolerance test (GTT)        6. Diet controlled gestational diabetes mellitus (GDM) in third trimester                Plan:  The patient will return to the office for her next visit in 1 weeks. See antepartum flow sheet.       The patient, Donnetta Gillin is a 43 y.o. female, was seen with a total time spent of 30 minutes for the visit on this date of service by the E/M provider Excluding any time spent for antenatal testing. The time component had both face to face and non face to face time spent in determining the total time component.  Counseling and education regarding her diagnosis listed below and her options regarding those diagnoses were also included in determining her time component.      Diagnosis Orders   1. [redacted] weeks gestation of pregnancy  Culture, Strep B Screen, Vaginal/Rectal      2. Antepartum multigravida of advanced maternal age        41. GBS (group B Streptococcus  carrier), +RV culture, currently pregnant        4. 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 Abnormal glucose tolerance test (GTT)        6. Diet controlled gestational diabetes mellitus (GDM) in third trimester             The patient had her preventative health maintenance recommendations and follow-up reviewed with her at the completion of her visit.

## 2023-09-26 LAB — CULTURE, STREP B SCREEN, VAGINAL/RECTAL

## 2023-09-29 ENCOUNTER — Encounter
Admit: 2023-09-29 | Discharge: 2023-09-29 | Payer: PRIVATE HEALTH INSURANCE | Attending: Obstetrics & Gynecology | Primary: Student in an Organized Health Care Education/Training Program

## 2023-09-29 ENCOUNTER — Encounter
Admit: 2023-09-29 | Discharge: 2023-09-29 | Payer: PRIVATE HEALTH INSURANCE | Primary: Student in an Organized Health Care Education/Training Program

## 2023-09-29 VITALS — BP 119/66 | HR 86

## 2023-09-29 DIAGNOSIS — O0993 Supervision of high risk pregnancy, unspecified, third trimester: Secondary | ICD-10-CM

## 2023-09-29 NOTE — Progress Notes (Signed)
Rodney Gudger is a 43 y.o. female [redacted]w[redacted]d    [phone]    OB History   Gravida Para Term Preterm AB Living   5 3 3   1 3    SAB IAB Ectopic Molar Multiple Live Births   1         3      # Outcome Date GA Lbr Len/2nd Weight Sex Type Anes PTL Lv   5 Current            4 SAB 05/2021           3 Term 11/17/16    F Vag-Spont   LIV   2 Term 05/04/09    F Vag-Spont   LIV   1 Term 05/02/07    F Vag-Spont   LIV       Vitals  BP: 119/66  Pulse: 86  Patient Position: Sitting  Albumin: Negative  Glucose: Negative  Fundal Height (cm): 38 cm  Fetal HR: 146  Movement: Present  Presentation: Vertex  Dilation (cm): 1  Effacement: 60  Station: -1      The patient was seen and evaluated. There was positive fetal movements. No contractions or leakage of fluid. Signs and symptoms of labor were reviewed.  The S/S of Pre-Eclampsia were reviewed with the patient in detail. She is to report any of these if they occur. She currently denies any of these.    The patient was instructed on fetal kick counts and was given a kick sheet to complete every 8 hours. She was instructed that the baby should move at a minimum of ten times within one hour after a meal. The patient was instructed to lay down on her left side twenty minutes after eating and count movements for up to one hour with a target value of ten movements.  She was instructed to notify the office if she did not make that target after two attempts or if after any attempt there was less than four movements.    The patient reports that the targets have been made Yes.    Previously Patient enrolled in Maternity Care Management Episode  +GBS treat per protocol in labor    EDD by [facility]:     High Risk Dx:  AMA, Diet controlled GDM, two suspected fetal VSD    PT PREFERS DR. MORELLI FOR DELIVERY IF IN TOWN    PRENATAL LAB RESULTS:   Pre-pregnancy: BMI  Blood Type/Rh: B+  Antibody Screen: neg  Hemoglobin, Hematocrit: 11.6/35.3  Rubella: immune  T. Pallidum, IgG: neg  Hepatitis B Surface  Antigen: neg  TSH: 2.36  HIV: neg  Sickle Cell Screen: n/a  Gonorrhea: neg  Chlamydia: neg  Urine culture: neg    Early  hour Glucose Tolerance Test:     28 wk 1 hr GTT 174    Glucose Fasting: 156  1 hour Glucose Tolerance Test: 184  2 hour Glucose Tolerance Test: 171  3 hour Glucose Tolerance Test: 88    Group B Strep:  +GBS    Cystic Fibrosis Screen:  neg  First Trimester Screen:  neg  Quad Screen:  not done  Anatomy US:     Tdap: yes  Date given: 09/10/2023  Flu shot:   COVID Shot:     T-Dap Vaccine Completed (27-36 weeks): Yes    RSV Vaccine (32-36 weeks): awaiting    Allergies:  Allergies as of 09/29/2023 - Fully Reviewed 09/29/2023   Allergen Reaction Noted  Seasonal  08/27/2023         Group Beta Strep collection was completed. Yes  GBS Results:   Hospital Outpatient Visit on 09/24/2023   Component Date Value Ref Range Status    Specimen Description 09/24/2023 .VAGINA   Final    Special Requests 09/24/2023 Site: Genital   Final    Culture 09/24/2023 STREPTOCOCCI, BETA HEMOLYTIC GROUP B ISOLATED (A)   Final   Hospital Outpatient Visit on 09/10/2023   Component Date Value Ref Range Status    Source 09/10/2023 .VAGINAL SWAB   Final    Trichomonas 09/10/2023 NEGATIVE  NEGATIVE Final    for Trichomonas Vaginalis    GARDNERELLA VAGINALIS 09/10/2023 POSITIVE (A)  NEGATIVE Final    for Gardnerella vaginalis    Candida species 09/10/2023 POSITIVE (A)  NEGATIVE Final    Comment: for Candida sp.  Method of testing is a DNA probe intended for detection and identification of Candida   species, Gardnerella vaginalis, and Trichomonas vaginalis nucleic acid in vaginal fluid   specimens from patients with symptoms of vaginitis/vaginosis.      Specimen Description 09/10/2023 .CERVIX   Final    C. trachomatis DNA 09/10/2023 NEGATIVE  NEGATIVE Final    Comment: CHLAMYDIA TRACHOMATIS DNA not detected by nucleic acid amplification.        This test is intended for medical purposes only and is not valid for the evaluation of    suspected sexual abuse or for other forensic purposes.  In certain contexts, culture may be required to meet applicable laws and regulations for   diagnosis of C. trachomatis and N. gonorrhoeae infections.  Per 2014  CDC recommendations, this test does not include confirmation of positive results   by an alternative nucleic acid target.      N. gonorrhoeae DNA 09/10/2023 NEGATIVE  NEGATIVE Final    Comment: NEISSERIA GONORRHOEAE DNA not detected by nucleic acid amplification.        This test is intended for medical purposes only and is not valid for the evaluation of   suspected sexual abuse or for other forensic purposes.  In certain contexts, culture may be required to meet applicable laws and regulations for   diagnosis of C. trachomatis and N. gonorrhoeae infections.  Per 2014  CDC recommendations, this test does not include confirmation of positive results   by an alternative nucleic acid target.     ]        Cervical Exam was:   1/60%/-1/V/I cm dilated    The literature regarding a questionable link to pitocin augmentation and induction of labor, the assistance of labor contractions and the initiation of contractions to help delivery, have been reviewed with the patient regarding the increased potential of having a newborn with Attention Deficit Hyperactivity Disorder and or Autism. These two disorders and the ramifications of their impact on a child and the family caring for that child has been reviewed with the patient in detail. She was given the risks, benefits and alternatives of the use of this medication. She has agreed to its use in the delivery of her unborn child if needed at the time of delivery, Yes.      The patient was counseled on the mandatory call ahead policy. She has been instructed to call the office at anytime prior to going into the hospital so the on-call physician may direct her to the appropriate facility for care. Exceptions were reviewed including but not limited to: Decreased  fetal movement, vaginal Bleeding  or hemorrhage, trauma, readily expectant delivery, or any instance where she feels 911 should be utilized.      The patient was counseled on Labor & Delivery.   Route of delivery and counseling on vaginal, operative vaginal, and cesarean sections were completed with the risks of each to both the patient as well as her baby. The possibility of a blood transfusion was discussed as well. The patient was not opposed to receiving a transfusion if needed. The patient was counseled on types of analgesia during labor and is considering either Regional or IV medication the risks, benefits and alternatives were discussed.     Antenatal Testing:  NST (Non Stress Test) Worksheet  09/29/23    Diagnosis:  Gearldean Lomanto is a 43 y.o. female  G5P3013  [redacted]w[redacted]d  1. High-risk pregnancy in third trimester    2. Antepartum multigravida of advanced maternal age    3. GBS (group B Streptococcus carrier), +RV culture, currently pregnant    4. 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 Abnormal glucose tolerance test (GTT)    6. Diet controlled gestational diabetes mellitus (GDM) in third trimester    7. Subchorionic hemorrhage of placenta in first trimester (Resolved)    8. History of miscarriage    9. [redacted] weeks gestation of pregnancy          Indication for Testing:  gestational diabetes mellitus      Scheduled Procedure:  Non Stress Test (59025-CPT)      Findings:  Total time of tracing to complete testing: 20 Minutes (Minimum must be 20 minutes)  NST is  reactive  CATEGORY 1 TRACING  Variability is moderate  Baseline FHR of 130 bpm    Accelerations are identified 15 beats above the baseline for 15 minutes: Yes (>[redacted] weeks gestation)    Accelerations are identified 10 beats above the baseline for 10 minutes: NA (28-[redacted] weeks gestation)        RTO 7 DAYS FOR A FOLLOW UP APPOINTMENT WITH ANTENATAL TESTING.    The patient, Missey Hasley is a 43 y.o. female, was seen with a total time spent  of 20 minutes for the visit on this date of service by the provider for Antenatal testing only. The time component had both face to face and non face to face time spent in determining the total time component.  Counseling and education regarding her diagnosis listed below and her options regarding those diagnoses were also included in determining her time component.      Diagnosis Orders   1. High-risk pregnancy in third trimester  FETAL NON-STRESS TEST      2. Antepartum multigravida of advanced maternal age  FETAL NON-STRESS TEST      3. GBS (group B Streptococcus carrier), +RV culture, currently pregnant        4. 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 Abnormal glucose tolerance test (GTT)        6. Diet controlled gestational diabetes mellitus (GDM) in third trimester  FETAL NON-STRESS TEST      7. Subchorionic hemorrhage of placenta in first trimester (Resolved)        8. History of miscarriage        9. [redacted] weeks gestation of pregnancy  FETAL NON-STRESS TEST           The patient had her preventative health maintenance recommendations and follow-up reviewed with her at the  completion of her visit.        Electronically signed by Para Skeans, DO on 09/29/2023 at 12:40 PM         Assessment:  1. Elcie Kostelecky is a 43 y.o. female  2. [id number]  3. 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   Increased risk of Down Syndrome - the most common chromosomal birth defect (chromosomes - cells that carry genes and transmit heredity information)   Increased risk of miscarriage   20% increase at ages 47 to 80   35% increase at ages 72-44   Over 50% increase by age 33  Increased risk of Cesarean Section (C-Section) for delivery method   Gestational diabetes - diabetes that develops for the first time during pregnancy. Women who have this could possibly have a very large baby who then is at risk for injuries during delivery.   Pregnancy Induced Hypertension - High blood pressure   Placental Problems - one of the most common placental problems is placenta previa in which the placenta covers all or part of the uterine opening (cervix). This can cause severe bleeding during delivery and can make C-section delivery necessary.  Even with all of these increased risks, with the advancement in medical procedures and testing, there are several ways to reduce your risk. They include early and regular prenatal care, taking the multivitamin with folic acid prescribed by your health care manager, beginning pregnancy at a healthy weight, not smoking or drinking alcoholic beverages, and eating healthy foods.   There are tests that all pregnant women are encouraged to take, but there is additional prenatal testing that is regularly offered to any woman 4 or older because of the potential of increased risks to the mother and baby. These tests are chorionic villus sampling (CVS) and amniocentesis. NIPT is also available which is a non-invasive option for fetal karyotyping.  With CVS, a small sample of cells  (called chorionic villi) is taken from the placenta where it attached to the wall of the uterus. Chorionic villi are tiny parts of the placenta; therefore they have the same genes as the baby. This test is 98% accurate, but can carry a slightly higher risk of miscarriage than amniocentesis, since the procedure is done in early pregnancy.  Amniocentesis is a procedure where a sample of fluid is removed from the amniotic sac for analysis and evaluation. During this procedure, fluid is removed by placing a long needle through the abdominal wall into the amniotic sac. The amniocentesis needle is typically guided into the sac with the help of ultrasound imaging. Once the needle is in the sac, a syringe is used to withdraw the clear amber-colored amniotic fluid, which looks a bit like urine.  NIPT, utilizes the maternal blood to test for fetal cells. These fetal cells are then karyotyped for genetic evaluation.  All of these tests, CVS, NIPT and amniocentesis, let couples know if the fetus will have genetic abnormalities, and will help them make informed decisions regarding their pregnancy. Karyotyping by either CVS, NIPT or Amniocentesis is the ONLY way to confirm the genetic chromosomal structure regarding trisomy; Down's Syndrome, Edward's or Pateau's. [facility] [place] was reviewed. If NIPT is positive then a confirmatory amniocentesis would be recommended to confirm the diagnosis.  TOPSTOH guidelines were reviewed.        Asymptomatic bacteriuria during pregnancy 02/24/2023    Vaginal bleeding in pregnancy 02/24/2023    Nasal polyp 10/17/2022        Diagnosis Orders   1. High-risk pregnancy in third trimester  FETAL NON-STRESS TEST      2. Antepartum multigravida of advanced maternal age  FETAL NON-STRESS TEST      3. GBS (group B Streptococcus carrier), +RV culture, currently pregnant        4. 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 Abnormal glucose tolerance test  (GTT)        6. Diet controlled gestational diabetes mellitus (GDM) in third trimester  FETAL NON-STRESS TEST      7. Subchorionic hemorrhage of placenta in first trimester (Resolved)        8. History of miscarriage        9. [redacted] weeks gestation of pregnancy  FETAL NON-STRESS TEST              Plan:  The patient will return to the office for her next visit in 1 weeks. See antepartum flow sheet.   Pt wishes IOL 39 weeks. Pt recently started metformin for DM 1 week ago. Sugars improving. +GBS. Pt to be induced 10/05/2023 8 pm with GBS prophylaxis and IOL @ midnight. Risks benefits/ alternative options reviewed with pt.   Medical Induction of Labor     Definition   In some cases, the doctor needs to help labor begin by using:   Medicine   Other procedures   This is done instead of waiting for your body to go into labor on its own. In the [place], nearly one in five labors is induced.   Reasons for Procedure   The most common reason to have an induction is that the pregnancy has gone two or more weeks past the due date. In this situation, the baby may:   Get too large for a vaginal delivery   Not be able to receive enough oxygen through the placenta (the organ that links the mother and the baby)   Other reasons for induction include:   Water breaks and contractions do not begin   High blood pressure or diabetes in the mother   Infection in the uterus   The baby is not growing properly   Low amniotic fluid level   Possible Complications   The same  complications that may occur from a spontaneous delivery also apply to an induced delivery, as well as the following risks:   Stalled labor--If the medicine does not trigger labor, you may need a cesarean section (C-section).   Strong contractions--The medicine that causes contractions could make them too strong. Although rare, this can lead to fetal distress and uterine rupture. In the event that your contractions are too strong, your doctor will lower the dose or stop the medicine.    Be sure to discuss these risks with your doctor before the procedure.   What to Expect   Prior to Procedure   While the same instructions apply for an induced labor as for a spontaneous birth, there are some differences. Do not eat too much before arriving at the hospital. (It is okay to have clear fluids, though.) The medicines can create very strong contractions and could upset your stomach. Contractions slow the digestive process, so your stomach will remain full. This can cause a problem if you need general anesthesia .   Description of the Procedure Cervical Ripening   To deliver your baby vaginally, the cervix will need to "ripen." This means it needs to soften, thin, and open to prepare for delivery. If your cervix is not doing this already, your doctor may aid this process by giving you medicine, which may be a:   Gel that is applied to the cervix   Suppository put in the vagina   Pill taken by mouth   The cervical ripening process can last for up to a few days.   There are also procedures that your doctor may try to aid cervical ripening, such as:   Strip the membranes (separate your cervix from the tissues around the baby's head)   Expand a small balloon-tipped catheter in the uterus   Place small cylinders that contain a type of sponge-like seaweed into the uterus     Changes in the Cervix During Pregnancy   Contractions   If contractions have not started once your cervix is ripe, your doctor will give you a drug that causes contractions. The drug is a man-made version of a hormone called oxytocin. This hormone is produced by your body during active labor. The drug will be adjusted during labor to strengthen or weaken the contractions. Once contractions begin, the labor and birth process will be the same as a spontaneous delivery.   Anesthesia   The same pain medicines are available for an induced labor as for a spontaneous delivery, including:   Pain medicine given into your vein   Epidural block    Spinal block   Local anesthesia   Immediately After Procedure   If everything goes well, after the induction you will vaginally deliver a healthy baby.   How Long Will It Take?   It can be hours to several days (very rarely) from the time you are induced until the delivery. If your cervix is not ripe when you are scheduled for the induction, labor and delivery could take 2-3 days. It could take longer for first-time mothers and for pre-term babies.   How Much Will It Hurt?   Labor causes severe pain. Talk to your doctor about ways to manage the pain.   Average Hospital Stay   The usual length of stay is 1-3 days. Your doctor may choose to keep you longer if you have any problems.   Postoperative Care   The care after an induced labor is the same as for  a spontaneous birth.   Call Your Doctor   When you go home after having a vaginal delivery, call your doctor if any of the following occurs:   An unexplained fever of 100.22F (38C) or above in the first two weeks   Soaking more than one sanitary napkin an hour or if the bleeding level increases   Wounds that become red, swollen, or drain pus   New pain, swelling, or tenderness in your legs   Hot-to-the-touch, significantly reddened, sore breasts   Any cracking or bleeding from the nipple or areola (the dark-colored area of the breast)   Foul-smelling vaginal discharge   Painful urination or a sudden urge to urinate, inability to control urination   Increasing pain in the vaginal area   Cough or chest pain, nausea, or vomiting   Depression, hallucinations, suicidal thoughts, or any thoughts of harming your baby   In case of an emergency, CALL 911 .     The patient was counseled in depth regarding the facility that an induction will take place.  They were informed that this decision will be made jointly with the patient and the physician but may need to be at a specific facility that the on call physician determines is best for the expectant mother or her unborn  child.  This decision is made for the safety of the patient and is made considering on call staff, current situations the on call physician is dealing with and the current care responsibilities for all of the patients the on call physician is responsible for at the time of the induction.  Unfortunately the patient may have to be induced at a facility which is not her initial choice. She was also informed that the scheduled induction and facility may need to be changed due to the aforementioned constraints the on call physician is managing.   This was discussed with the patient in detail and all questions were answered and she verbalized understanding.        The patient, Endora Teresi is a 43 y.o. female, was seen with a total time spent of 30 minutes for the visit on this date of service by the E/M provider Excluding any time spent for antenatal testing. The time component had both face to face and non face to face time spent in determining the total time component.  Counseling and education regarding her diagnosis listed below and her options regarding those diagnoses were also included in determining her time component.      Diagnosis Orders   1. High-risk pregnancy in third trimester  FETAL NON-STRESS TEST      2. Antepartum multigravida of advanced maternal age  FETAL NON-STRESS TEST      3. GBS (group B Streptococcus carrier), +RV culture, currently pregnant        4. 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 Abnormal glucose tolerance test (GTT)        6. Diet controlled gestational diabetes mellitus (GDM) in third trimester  FETAL NON-STRESS TEST      7. Subchorionic hemorrhage of placenta in first trimester (Resolved)        8. History of miscarriage        9. [redacted] weeks gestation of pregnancy  FETAL NON-STRESS TEST           The patient had her preventative health maintenance recommendations and follow-up reviewed with her at the completion of her visit.

## 2023-10-01 ENCOUNTER — Ambulatory Visit
Admit: 2023-10-01 | Discharge: 2023-10-05 | Payer: PRIVATE HEALTH INSURANCE | Attending: Maternal & Fetal Medicine | Primary: Student in an Organized Health Care Education/Training Program

## 2023-10-01 VITALS — BP 109/73 | HR 97 | Temp 97.50000°F | Resp 16 | Ht 70.0 in | Wt 222.0 lb

## 2023-10-01 DIAGNOSIS — Z3490 Encounter for supervision of normal pregnancy, unspecified, unspecified trimester: Secondary | ICD-10-CM

## 2023-10-01 NOTE — Progress Notes (Signed)
[facility]   [facility] [place], [facility]  [facility]  [address]  [place] [address]  Dept: [phone]     10/01/2023    RE:  Heather Huerta     DOB: Nov 28, 1980   AGE: 43 y.o.     Dear Dr. Richrd Prime,    Thank you for allowing me to see Heather Huerta.  As I'm sure you will recall, Heather Huerta is a 43 y.o. [id number]'s last menstrual period was 01/06/2023 (exact date). 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 05/2023 06/14/2023    GBS (group B Streptococcus carrier), +RV culture, currently pregnant 04/05/2023    Missed ab 06/27/2021    Fifth pregnancy 09/17/2023    [redacted] weeks gestation of pregnancy 08/22/2023    Antepartum multigravida of advanced maternal age 56/04/2023    Asymptomatic bacteriuria during pregnancy 02/24/2023    Vaginal bleeding in pregnancy 02/24/2023    Nasal polyp 10/17/2022        PAST HISTORY:  OB History   Gravida Para Term Preterm AB Living   5 3 3   1 3    SAB IAB Ectopic Molar Multiple Live Births   1         3      # Outcome Date GA Lbr Len/2nd Weight Sex Type Anes PTL Lv   5 Current            4 SAB 05/2021           3 Term 11/17/16    F Vag-Spont   LIV   2 Term 05/04/09    F Vag-Spont   LIV   1 Term 05/02/07    F Vag-Spont   LIV          MEDICAL:  History reviewed. No pertinent past medical history.     SURGICAL:  Past Surgical History:   Procedure Laterality Date    INTRAUTERINE DEVICE INSERTION N/A 08/09/2018    MIrena insertion NDC [id number]       ALLERGIES:    Allergies   Allergen Reactions    Seasonal          MEDICATIONS:    Current Outpatient Medications   Medication Sig  Dispense Refill    metFORMIN (GLUCOPHAGE-XR) 500 MG extended release tablet Take 1 tablet by mouth in the morning and at bedtime 30 tablet 0    Alcohol Swabs (ALCOHOL PREP) 70 % PADS 1 each by Does not apply route in the morning, at noon, in the evening, and at bedtime 120 each 8    blood glucose monitor strips Test 4 times a day & as needed for symptoms of irregular blood glucose. Dispense sufficient amount for indicated testing frequency plus additional to accommodate PRN testing needs. 120 strip 8    glucose monitoring kit 1 kit by Does not apply route in the morning, at noon, in the evening, and at bedtime 1 kit 0    FreeStyle Lancets MISC 1 each by Does not apply route 4 times daily 120 each 8    famotidine (PEPCID) 20 MG tablet Take 1 tablet  by mouth 2 times daily 60 tablet 3    aspirin (ASPIRIN CHILDRENS) 81 MG chewable tablet Take 1 tablet by mouth daily 30 tablet 8    Prenatal Vit-Fe Fumarate-FA (PRENATAL VITAMIN) 27-0.8 MG TABS Take 1 tablet by mouth daily 30 tablet 11     No current facility-administered medications for this visit.        Social History     Socioeconomic History    Marital status: Unknown     Spouse name: None    Number of children: None    Years of education: None    Highest education level: None   Tobacco Use    Smoking status: Never     Passive exposure: Never    Smokeless tobacco: Never   Vaping Use    Vaping status: Never Used   Substance and Sexual Activity    Alcohol use: Not Currently    Drug use: Never    Sexual activity: Yes     Partners: Male     Social Determinants of Health     Financial Resource Strain: Low Risk  (10/16/2022)    Overall Financial Resource Strain (CARDIA)     Difficulty of Paying Living Expenses: Not hard at all   Food Insecurity: No Food Insecurity (10/16/2022)    Hunger Vital Sign     Worried About Running Out of Food in the Last Year: Never true     Ran Out of Food in the Last Year: Never true   Transportation Needs: Unknown (10/16/2022)    PRAPARE -  Transportation     Lack of Transportation (Non-Medical): No   Housing Stability: Unknown (10/16/2022)    Housing Stability Vital Sign     Unstable Housing in the Last Year: No          FAMILY MEDICAL HISTORY:   Family History   Problem Relation Age of Onset    Heart Attack Mother     High Cholesterol Brother           PHYSICAL EXAMINATION:  BP 109/73   Pulse 97   Temp 97.5 F (36.4 C)   Resp 16   Ht 1.778 m (5\' 10" )   Wt 100.7 kg (222 lb)   LMP 01/06/2023 (Exact Date)   Body mass index is 31.85 kg/m.    Urine dipstick:  No results found for this visit on 10/01/23.     IMPRESSION:  1. Singleton fetus in a Cephalic presentation. Fetal motion and cardiac motion is seen and appears normal.  2. The placenta is Anterior. The amniotic fluid is normal.  3. Fetal anomalies/findings: At least two (2) suspected fetal cardiac ventricular septal defects (VSDs) again noted.   4. Biophysical profile is 8/8.  5.  I reviewed her blood sugars her blood sugar control is improving and is still not wonderful but overall I think it is much better than it was.    RECOMMENDATIONS:  Each of the recommendations were discussed with the patient:  1.  Delivery at 39 weeks.  The induction could be started at 12:05 AM.  2.  Follow-up would be otherwise as clinically indicated.    The patient is to continue to follow with you in your office for ongoing obstetric care.     PLAN:    As noted above or sooner prn.    Sincerely,        Ladona Mow, MD    I spent 25 minutes of direct contact time with the patient  of which greater than 50% of the time was used to counsel the patient, discuss complications and problems related to her pregnancy, or coordinating her care in addition to the time spent interpreting the ultrasound. I answered all of her questions to her satisfaction.

## 2023-10-01 NOTE — Progress Notes (Signed)
Please refer to attached ultrasound report for doctor's evaluation of the clinical information obtained by vital signs, ultrasound, and/or non-stress test along with management recommendation.

## 2023-10-01 NOTE — Patient Instructions (Signed)
Pt advised to follow up with referring provider.

## 2023-10-02 ENCOUNTER — Inpatient Hospital Stay
Payer: PRIVATE HEALTH INSURANCE | Attending: Student in an Organized Health Care Education/Training Program | Primary: Student in an Organized Health Care Education/Training Program

## 2023-10-05 ENCOUNTER — Ambulatory Visit
Admit: 2023-10-06 | Payer: PRIVATE HEALTH INSURANCE | Primary: Student in an Organized Health Care Education/Training Program

## 2023-10-05 DIAGNOSIS — O24425 Gestational diabetes mellitus in childbirth, controlled by oral hypoglycemic drugs: Secondary | ICD-10-CM

## 2023-10-05 DIAGNOSIS — Z3A38 38 weeks gestation of pregnancy: Secondary | ICD-10-CM

## 2023-10-05 NOTE — Plan of Care (Signed)
Problem: Chronic Conditions and Co-morbidities  Goal: Patient's chronic conditions and co-morbidity symptoms are monitored and maintained or improved  Outcome: Progressing     Problem: Vaginal Birth or Cesarean Section  Goal: Fetal and maternal status remain reassuring during the birth process  Description:  Birth OB-Pregnancy care plan goal which identifies if the fetal and maternal status remain reassuring during the birth process  Outcome: Progressing     Problem: Pain  Goal: Verbalizes/displays adequate comfort level or baseline comfort level  Outcome: Progressing     Problem: Safety - Adult  Goal: Free from fall injury  Outcome: Progressing

## 2023-10-05 NOTE — H&P (Signed)
OBSTETRICAL HISTORY AND PHYSICAL  Wendall Mola    Date: 10/05/2023       Time: 8:57 PM   Patient Name: Heather Huerta     Patient DOB: [id number]  Room/Bed: 0706/0706-01    Admission Date/Time: 10/05/2023  8:32 PM    CC: Induction of labor     HPI: Heather Huerta is a 43 y.o. [id number] at [redacted]w[redacted]d who presents to l&D for scheduled induction of labor. Patient denies any fever, chills, N/V, headaches, vision changes, chest pain, shortness of breath, RUQ pain, abdominal pain, and increased swelling/tenderness in bilateral lower extremities. Patient denies any vaginal discharge and any urinary complaints. The patient reports fetal movement is present, denies contractions, denies loss of fluid, denies vaginal bleeding.    DATING:  LMP: Patient's last menstrual period was 01/06/2023 (exact date).  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 05/2023    Abnormal glucose tolerance test (GTT)    Gestational diabetes    [redacted] weeks gestation of pregnancy    Fifth pregnancy     Antenatal Steroids Given In This Pregnancy:  no     REVIEW OF SYSTEMS:  Constitutional: negative fever, negative chills  HEENT: negative visual disturbances, negative headaches  Respiratory: negative dyspnea, negative cough  Cardiovascular: negative chest pain,  negative palpitations  Gastrointestinal: negative abdominal pain, negative RUQ pain, negative N/V, negative diarrhea, negative constipation  Genitourinary: negative dysuria, negative vaginal discharge, negative vaginal bleeding  Dermatological: negative rash  Hematologic: negative  bruising  Immunologic/Lymphatic: negative recent illness, negative recent sick contact  Musculoskeletal: negative back pain, negative myalgias, negative arthralgias  Neurological:  negative dizziness, negative weakness  Behavior/Psych: negative depression, negative anxiety    OBSTETRIC HISTORY:   OB History   Gravida Para Term Preterm AB Living   5 3 3  0 1 3   SAB IAB Ectopic Molar Multiple Live Births   1 0 0 0 0 3      # Outcome Date GA Lbr Len/2nd Weight Sex Type Anes PTL Lv   5 Current            4 SAB 05/2021           3 Term 11/17/16    F Vag-Spont   LIV   2 Term 05/04/09    F Vag-Spont   LIV   1 Term 05/02/07    F Vag-Spont   LIV       PAST MEDICAL HISTORY:   has no past medical history on file.    PAST SURGICAL HISTORY:   has a past surgical history that includes intrauterine device insertion (N/A, 08/09/2018).    ALLERGIES:  is allergic to seasonal.    MEDICATIONS:  Prior to Admission medications    Medication Sig Start Date End Date Taking? Authorizing Provider   metFORMIN (GLUCOPHAGE-XR) 500 MG extended release tablet Take 1 tablet by mouth in the morning and at bedtime 09/24/23   Ladona Mow, MD   Alcohol Swabs (ALCOHOL PREP) 70 % PADS 1 each by Does not apply route  in the morning, at noon, in the evening, and at bedtime 08/11/23   Beam, Zenaida Deed, APRN - CNP   blood glucose monitor strips Test 4 times a day & as needed for symptoms of irregular blood glucose. Dispense sufficient amount for indicated testing frequency plus additional to accommodate PRN testing needs. 08/11/23   Beam, Zenaida Deed, APRN - CNP   glucose monitoring kit 1 kit by Does not apply route in the morning, at noon, in the evening, and at bedtime 08/11/23   Beam, Zenaida Deed, APRN - CNP   FreeStyle Lancets MISC 1 each by Does not apply route 4 times daily 08/11/23   Beam, Zenaida Deed, APRN - CNP   famotidine (PEPCID) 20 MG tablet Take 1 tablet by mouth 2 times daily 04/30/23   Beam, Zenaida Deed, APRN - CNP   aspirin (ASPIRIN CHILDRENS) 81 MG chewable  tablet Take 1 tablet by mouth daily 04/09/23   Beam, Zenaida Deed, APRN - CNP   Prenatal Vit-Fe Fumarate-FA (PRENATAL VITAMIN) 27-0.8 MG TABS Take 1 tablet by mouth daily 03/18/23   Polce, Jodelle Gross, APRN - NP       FAMILY HISTORY:  family history includes Heart Attack in her mother; High Cholesterol in her brother.    SOCIAL HISTORY:   reports that she has never smoked. She has never been exposed to tobacco smoke. She has never used smokeless tobacco. She reports that she does not currently use alcohol. She reports that she does not use drugs.    VITALS:  Vitals:    10/05/23 2120   Resp: 16   Temp: 98 F (36.7 C)   TempSrc: Oral   SpO2: 100%     PHYSICAL EXAM:  Fetal Heart Monitor:  Baseline Heart Rate 135, moderate variability, present accelerations, absent decelerations  Toco: none contractions      General appearance:  no apparent distress, alert, and cooperative  HEENT: head atraumatic, normocephalic, moist mucous membranes, trachea midline  Neurologic:  alert, oriented, normal speech, no focal findings or movement disorder noted  Lungs:  No increased work of breathing noted  Heart:  regular rate and rhythm  Abdomen:  soft, gravid, and non-tender, without peritoneal findings  Extremities:  no calf tenderness, non edematous   Musculoskeletal: Gross strength equal and intact throughout, no gross abnormalities, range of motion normal in hips, knees, shoulders and spine  Psychiatric: Mood appropriate, normal affect   Rectal Exam: not indicated  Pelvic Exam:  Chaperone for Intimate Exam: Chaperone was present for entire exam, Chaperone Name: IT sales professional, RN  Sterile Vaginal Exam:  Cervix: No cervical motion tenderness   Uterus: Is gravid, Normal size, shape, consistency and non-tender  Cervix: 2 dilated, 50 % effaced, -2 station, posterior position (out of 3 station), medium consistency, FETAL POSITION: Cephalic (confirmed by ultrasound), Membranes intact,     LIMITED BEDSIDE US:  Position: Cephalic  Placental Location:  anterior  Fetal Heart Tones: Present  Fetal Movement: Present  Amniotic Fluid Index/Volume: >2x2 cm  Estimated Fetal Weight:  7 lbs 11oz    PRENATAL LAB RESULTS:  Blood Type/Rh: B pos  Antibody Screen: negative  Hemoglobin, Hematocrit, Platelets: 11.3/34.8/204  Rubella: immune  RPR: ordered, not done  T. Pallidum, IgG: non-reactive (on admission)  Hepatitis B Surface Antigen: non-reactive   Hepatitis C Antibody: non-reactive   HIV: non-reactive   Gonorrhea: negative  Chlamydia: negative  Urine culture: not done    1 hour Glucose Tolerance Test:  174  3 hour Glucose Tolerance  Test: Fasting 156/1 hour 184/2 hour 171/3 hour 88    Group B Strep: positive on 09/24/23  Cystic Fibrosis Screen: negative  Sickle Cell Screen: not available  First Trimester Screen: low risk for aneuploidy  QUAD screen: not done  msAFP: not done  Non-Invasive Prenatal Testing: not available  Anatomy US: anterior placenta, 3VC, female gender, normal anatomy except for suspected fetal VSD    ASSESSMENT & PLAN:  Heather Huerta is a 43 y.o. female [phone] at [redacted]w[redacted]d IUP   - GBS positive / Rh positive / R immune   - Pen G for GBS prophylaxis     IOL 2/2 GDMA2 (starting @ [redacted]w[redacted]d which will be @ [place] on 10/06/23)   - Admit to labor and delivery under the service of Dr. Olevia Bowens   - VSS    - cEFM and TOCO   - Cat 1 FHT and TOCO showing no contractions   - CBC, T&S, T.Pal ordered   - UDS ordered. R/B/A discussed with patient and patient agreeable   - IVF: NS @ 125 cc/hr   - Patient compliant with Metformin 500 mg BID XR in this pregnancy   - Plan for POCT glucose fasting and 2 hr PP glucose with OB insulin sliding scale for strict glycemic coverage during intrapartum period   - Plan of Induction: Cytotec 25 PV with foley balloon to be placed at midnight. 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   Increased risk of Down Syndrome - the most common chromosomal birth defect (chromosomes - cells that carry genes and transmit heredity information)   Increased risk of miscarriage   20% increase at ages 44 to 5   35% increase at ages 110-44   Over 50% increase by age 76  Increased risk of Cesarean Section (C-Section) for delivery method   Gestational diabetes - diabetes that develops for the first time during pregnancy. Women who have this could possibly have a very large baby who then is at risk for injuries during delivery.   Pregnancy Induced Hypertension - High blood pressure   Placental Problems - one  of the most common placental problems is placenta previa in which the placenta covers all or part of the uterine opening (cervix). This can cause severe bleeding during delivery and can make C-section delivery necessary.  Even with all of these increased risks, with the advancement in medical procedures and testing, there are several ways to reduce your risk. They include early and regular prenatal care, taking the multivitamin with folic  acid prescribed by your health care manager, beginning pregnancy at a healthy weight, not smoking or drinking alcoholic beverages, and eating healthy foods.   There are tests that all pregnant women are encouraged to take, but there is additional prenatal testing that is regularly offered to any woman 28 or older because of the potential of increased risks to the mother and baby. These tests are chorionic villus sampling (CVS) and amniocentesis. NIPT is also available which is a non-invasive option for fetal karyotyping.  With CVS, a small sample of cells (called chorionic villi) is taken from the placenta where it attached to the wall of the uterus. Chorionic villi are tiny parts of the placenta; therefore they have the same genes as the baby. This test is 98% accurate, but can carry a slightly higher risk of miscarriage than amniocentesis, since the procedure is done in early pregnancy.   Amniocentesis is a procedure where a sample of fluid is removed from the amniotic sac for analysis and evaluation. During this procedure, fluid is removed by placing a long needle through the abdominal wall into the amniotic sac. The amniocentesis needle is typically guided into the sac with the help of ultrasound imaging. Once the needle is in the sac, a syringe is used to withdraw the clear amber-colored amniotic fluid, which looks a bit like urine.  NIPT, utilizes the maternal blood to test for fetal cells. These fetal cells are then karyotyped for genetic evaluation.  All of these tests, CVS, NIPT and amniocentesis, let couples know if the fetus will have genetic abnormalities, and will help them make informed decisions regarding their pregnancy. Karyotyping by either CVS, NIPT or Amniocentesis is the ONLY way to confirm the genetic chromosomal structure regarding trisomy; Down's Syndrome, Edward's or Pateau's. TOP ST OH was reviewed. If NIPT is positive then a confirmatory amniocentesis would be recommended to confirm the  diagnosis.  TOPSTOH guidelines were reviewed.        Asymptomatic bacteriuria during pregnancy 02/24/2023    Vaginal bleeding in pregnancy 02/24/2023    Nasal polyp 10/17/2022       Plan discussed with Dr. Olevia Bowens, who is agreeable.     Steroids given this admission: No    Risks, benefits, alternatives and possible complications have been discussed in detail with the patient. Admission, and post admission procedures and expectations were discussed in detail. All questions were answered.    Attending's Name: Dr. Duane Boston, MD  Ob/Gyn Resident  10/05/2023, 8:57 PM

## 2023-10-05 NOTE — Other (Signed)
Patient arrives to L&D for RR IOL.  Patient oriented to room and surroundings. Patient denies ctx, pain, bleeding and LOF.  Patient plans for an epidural.  Urine specimen obtained. Vital signs stable. Residents notified of pts admission.

## 2023-10-06 ENCOUNTER — Inpatient Hospital Stay
Admit: 2023-10-06 | Discharge: 2023-10-07 | Disposition: A | Discharge status: Home / Self Care | Payer: PRIVATE HEALTH INSURANCE | Attending: Obstetrics & Gynecology | Admitting: Obstetrics & Gynecology

## 2023-10-06 LAB — CBC WITH AUTO DIFFERENTIAL
Basophils %: 1 % (ref 0–2)
Basophils Absolute: 0.06 10*3/uL (ref 0.00–0.20)
Eosinophils %: 1 % (ref 1–4)
Eosinophils Absolute: 0.07 10*3/uL (ref 0.00–0.44)
Hematocrit: 34.9 % — ABNORMAL LOW (ref 36.3–47.1)
Hemoglobin: 11.8 g/dL — ABNORMAL LOW (ref 11.9–15.1)
Immature Granulocytes %: 0 %
Immature Granulocytes Absolute: <0.03 10*3/uL (ref 0.00–0.30)
Lymphocytes %: 30 % (ref 24–43)
Lymphocytes Absolute: 2.09 10*3/uL (ref 1.10–3.70)
MCH: 30.1 pg (ref 25.2–33.5)
MCHC: 33.8 g/dL (ref 28.4–34.8)
MCV: 89 fL (ref 82.6–102.9)
MPV: 12.3 fL (ref 8.1–13.5)
Monocytes %: 7 % (ref 3–12)
Monocytes Absolute: 0.49 10*3/uL (ref 0.10–1.20)
NRBC Automated: 0 /100{WBCs}
Neutrophils %: 61 % (ref 36–65)
Neutrophils Absolute: 4.19 10*3/uL (ref 1.50–8.10)
Platelets: 162 10*3/uL (ref 138–453)
RBC: 3.92 m/uL — ABNORMAL LOW (ref 3.95–5.11)
RDW: 14.6 % — ABNORMAL HIGH (ref 11.8–14.4)
WBC: 6.9 10*3/uL (ref 3.5–11.3)

## 2023-10-06 LAB — URINE DRUG SCREEN
Amphetamine Screen, Ur: NEGATIVE
Barbiturate Screen, Ur: NEGATIVE
Benzodiazepine Screen, Urine: NEGATIVE
Cannabinoid Scrn, Ur: NEGATIVE
Cocaine Metabolite, Urine: NEGATIVE
Fentanyl, Ur: NEGATIVE
Methadone Screen, Urine: NEGATIVE
Opiates, Urine: NEGATIVE
Oxycodone Screen, Ur: NEGATIVE
Phencyclidine, Urine: NEGATIVE

## 2023-10-06 LAB — POC GLUCOSE FINGERSTICK
POC Glucose: 89 mg/dL (ref 65–105)
POC Glucose: 87 mg/dL (ref 65–105)

## 2023-10-06 LAB — TYPE AND SCREEN
ABO/Rh: B POS
Antibody Screen: NEGATIVE

## 2023-10-06 LAB — T. PALLIDUM AB: T. pallidum, IgG: NONREACTIVE

## 2023-10-06 MED ORDER — SODIUM CHLORIDE 0.9 % IV SOLN
0.9 % | INTRAVENOUS | Status: AC | PRN
Start: 2023-10-06 — End: 2023-10-06

## 2023-10-06 MED ORDER — METFORMIN HCL ER 500 MG PO TB24
500 MG | Freq: Two times a day (BID) | ORAL | Status: AC
Start: 2023-10-06 — End: 2023-10-07
  Administered 2023-10-07 (×2): 500 mg via ORAL

## 2023-10-06 MED ORDER — SODIUM CHLORIDE 0.9 % IV SOLN
0.9 % | INTRAVENOUS | Status: DC
Start: 2023-10-06 — End: 2023-10-07
  Administered 2023-10-06: 04:00:00 via INTRAVENOUS

## 2023-10-06 MED ORDER — BENZOCAINE-MENTHOL 20-0.5 % EX AERO
20-0.5 % | CUTANEOUS | Status: DC | PRN
Start: 2023-10-06 — End: 2023-10-07
  Administered 2023-10-07: via TOPICAL

## 2023-10-06 MED ORDER — WITCH HAZEL-GLYCERIN EX PADS
CUTANEOUS | Status: AC | PRN
Start: 2023-10-06 — End: 2023-10-06

## 2023-10-06 MED ORDER — INSULIN LISPRO 100 UNIT/ML IJ SOLN
100 UNIT/ML | Freq: Four times a day (QID) | INTRAMUSCULAR | Status: AC | PRN
Start: 2023-10-06 — End: 2023-10-06

## 2023-10-06 MED ORDER — DIPHENHYDRAMINE HCL 50 MG/ML IJ SOLN
50 MG/ML | INTRAMUSCULAR | Status: AC | PRN
Start: 2023-10-06 — End: 2023-10-06

## 2023-10-06 MED ORDER — ACETAMINOPHEN 500 MG PO TABS
500 | Freq: Four times a day (QID) | ORAL | Status: DC | PRN
Start: 2023-10-06 — End: 2023-10-06

## 2023-10-06 MED ORDER — BENZOCAINE-MENTHOL 20-0.5 % EX AERO
20-0.5 | CUTANEOUS | Status: DC | PRN
Start: 2023-10-06 — End: 2023-10-06

## 2023-10-06 MED ORDER — ROPIVACAINE 0.2% IN SODIUM CHLORIDE 0.9% 100 ML (OB) EPIDURAL
Status: DC
Start: 2023-10-06 — End: 2023-10-06

## 2023-10-06 MED ORDER — MEDELA TENDER CARE LANOLIN EX CREA
CUTANEOUS | Status: DC | PRN
Start: 2023-10-06 — End: 2023-10-06

## 2023-10-06 MED ORDER — BENZOCAINE-MENTHOL 20-0.5 % EX AERO
20-0.5 % | CUTANEOUS | Status: AC | PRN
Start: 2023-10-06 — End: 2023-10-06

## 2023-10-06 MED ORDER — ONDANSETRON HCL 4 MG/2ML IJ SOLN
42 MG/2ML | Freq: Four times a day (QID) | INTRAMUSCULAR | Status: AC | PRN
Start: 2023-10-06 — End: 2023-10-06

## 2023-10-06 MED ORDER — DEXTROSE 10 % IV SOLN
10 % | INTRAVENOUS | Status: AC | PRN
Start: 2023-10-06 — End: 2023-10-06

## 2023-10-06 MED ORDER — DEXTROSE 10 % IV BOLUS
INTRAVENOUS | Status: AC | PRN
Start: 2023-10-06 — End: 2023-10-06

## 2023-10-06 MED ORDER — SODIUM CHLORIDE 0.9 % IV SOLN
0.9 % | INTRAVENOUS | Status: DC | PRN
Start: 2023-10-06 — End: 2023-10-07

## 2023-10-06 MED ORDER — LIDOCAINE-EPINEPHRINE (PF) 1.5 %-1:200000 IJ SOLN
1.5-1200000 | Freq: Once | INTRAMUSCULAR | Status: DC | PRN
Start: 2023-10-06 — End: 2023-10-06
  Administered 2023-10-06: 13:00:00 5 via EPIDURAL

## 2023-10-06 MED ORDER — NORMAL SALINE FLUSH 0.9 % IV SOLN
0.9 % | Freq: Two times a day (BID) | INTRAVENOUS | Status: DC
Start: 2023-10-06 — End: 2023-10-07

## 2023-10-06 MED ORDER — ONDANSETRON HCL 4 MG/2ML IJ SOLN
42 MG/2ML | Freq: Four times a day (QID) | INTRAMUSCULAR | Status: DC | PRN
Start: 2023-10-06 — End: 2023-10-07

## 2023-10-06 MED ORDER — EPHEDRINE SULFATE (PRESSORS) 25 MG/5ML IV SOSY
255 | Freq: Once | INTRAVENOUS | Status: DC | PRN
Start: 2023-10-06 — End: 2023-10-06

## 2023-10-06 MED ORDER — ONDANSETRON HCL 4 MG/2ML IJ SOLN
42 | Freq: Once | INTRAMUSCULAR | Status: AC
Start: 2023-10-06 — End: 2023-10-05
  Administered 2023-10-06: 05:00:00 4 mg via INTRAVENOUS

## 2023-10-06 MED ORDER — LIDOCAINE HCL (PF) 1 % IJ SOLN
1 | Freq: Once | INTRAMUSCULAR | Status: DC | PRN
Start: 2023-10-06 — End: 2023-10-06
  Administered 2023-10-06: 13:00:00 3 via INTRAVENOUS

## 2023-10-06 MED ORDER — ACETAMINOPHEN 500 MG PO TABS
500 | Freq: Four times a day (QID) | ORAL | Status: DC
Start: 2023-10-06 — End: 2023-10-06

## 2023-10-06 MED ORDER — TETANUS-DIPHTH-ACELL PERTUSSIS 5-2.5-18.5 LF-MCG/0.5 IM SUSP
5-2.5-18.5-0.5 LF-MCG/0.5 | INTRAMUSCULAR | Status: DC
Start: 2023-10-06 — End: 2023-10-07

## 2023-10-06 MED ORDER — MORPHINE SULFATE 4 MG/ML IJ SOLN
4 | Freq: Once | INTRAMUSCULAR | Status: AC
Start: 2023-10-06 — End: 2023-10-05
  Administered 2023-10-06: 05:00:00 4 mg via INTRAVENOUS

## 2023-10-06 MED ORDER — LACTATED RINGERS IV SOLN
INTRAVENOUS | Status: DC
Start: 2023-10-06 — End: 2023-10-05

## 2023-10-06 MED ORDER — MORPHINE SULFATE 10 MG/ML IV SOLN
10 | Freq: Once | INTRAVENOUS | Status: AC
Start: 2023-10-06 — End: 2023-10-05
  Administered 2023-10-06: 05:00:00 6 mg via INTRAMUSCULAR

## 2023-10-06 MED ORDER — ONDANSETRON 4 MG PO TBDP
4 | Freq: Four times a day (QID) | ORAL | Status: DC | PRN
Start: 2023-10-06 — End: 2023-10-06

## 2023-10-06 MED ORDER — OXYTOCIN 30 UNITS IN 500 ML INFUSION
30500 UNIT/500ML | INTRAVENOUS | Status: AC | PRN
Start: 2023-10-06 — End: 2023-10-07

## 2023-10-06 MED ORDER — ROPIVACAINE HCL 2 MG/ML IJ SOLN
INTRAMUSCULAR | Status: DC | PRN
Start: 2023-10-06 — End: 2023-10-06
  Administered 2023-10-06: 14:00:00 10 via EPIDURAL
  Administered 2023-10-06: 14:00:00 5 via EPIDURAL

## 2023-10-06 MED ORDER — ROPIVACAINE HCL 2 MG/ML IJ SOLN
INTRAMUSCULAR | Status: AC
Start: 2023-10-06 — End: 2023-10-06
  Administered 2023-10-06: 14:00:00 10 via EPIDURAL

## 2023-10-06 MED ORDER — ONDANSETRON HCL 4 MG/2ML IJ SOLN
42 | Freq: Four times a day (QID) | INTRAMUSCULAR | Status: DC | PRN
Start: 2023-10-06 — End: 2023-10-06

## 2023-10-06 MED ORDER — NORMAL SALINE FLUSH 0.9 % IV SOLN
0.9 % | INTRAVENOUS | Status: DC | PRN
Start: 2023-10-06 — End: 2023-10-07

## 2023-10-06 MED ORDER — ACETAMINOPHEN 500 MG PO TABS
500 MG | ORAL_TABLET | Freq: Four times a day (QID) | ORAL | 1 refills | Status: DC | PRN
Start: 2023-10-06 — End: 2023-11-25

## 2023-10-06 MED ORDER — SIMETHICONE 80 MG PO CHEW
80 MG | Freq: Four times a day (QID) | ORAL | Status: AC | PRN
Start: 2023-10-06 — End: 2023-10-06

## 2023-10-06 MED ORDER — ONDANSETRON HCL 4 MG/2ML IJ SOLN
42 | Freq: Four times a day (QID) | INTRAMUSCULAR | Status: DC | PRN
Start: 2023-10-06 — End: 2023-10-06
  Administered 2023-10-06: 15:00:00 4 mg via INTRAVENOUS

## 2023-10-06 MED ORDER — GLUCOSE 4 G PO CHEW
4 g | ORAL | Status: AC | PRN
Start: 2023-10-06 — End: 2023-10-06

## 2023-10-06 MED ORDER — DIPHENHYDRAMINE HCL 25 MG PO TABS
25 MG | ORAL | Status: AC | PRN
Start: 2023-10-06 — End: 2023-10-06

## 2023-10-06 MED ORDER — IBUPROFEN 600 MG PO TABS
600 MG | Freq: Four times a day (QID) | ORAL | Status: DC
Start: 2023-10-06 — End: 2023-10-07
  Administered 2023-10-06 – 2023-10-07 (×3): 600 mg via ORAL

## 2023-10-06 MED ORDER — IBUPROFEN 600 MG PO TABS
600 MG | ORAL_TABLET | Freq: Four times a day (QID) | ORAL | 1 refills | Status: DC | PRN
Start: 2023-10-06 — End: 2023-11-25

## 2023-10-06 MED ORDER — MEDELA TENDER CARE LANOLIN EX CREA
CUTANEOUS | Status: DC | PRN
Start: 2023-10-06 — End: 2023-10-07

## 2023-10-06 MED ORDER — EPHEDRINE SULFATE (PRESSORS) 25 MG/5ML IV SOSY
255 | INTRAVENOUS | Status: DC | PRN
Start: 2023-10-06 — End: 2023-10-06

## 2023-10-06 MED ORDER — TETANUS-DIPHTH-ACELL PERTUSSIS 5-2.5-18.5 LF-MCG/0.5 IM SUSP
5-2.5-18.5-0.5 | INTRAMUSCULAR | Status: DC
Start: 2023-10-06 — End: 2023-10-06

## 2023-10-06 MED ORDER — NORMAL SALINE FLUSH 0.9 % IV SOLN
0.9 % | Freq: Two times a day (BID) | INTRAVENOUS | Status: AC
Start: 2023-10-06 — End: 2023-10-06
  Administered 2023-10-06: 03:00:00 10 mL via INTRAVENOUS

## 2023-10-06 MED ORDER — MISOPROSTOL 25 MCG PRE-SPLIT TABLET
25 | Freq: Once | ORAL | Status: AC
Start: 2023-10-06 — End: 2023-10-06
  Administered 2023-10-06: 05:00:00 25 ug via VAGINAL

## 2023-10-06 MED ORDER — DOCUSATE SODIUM 100 MG PO CAPS
100 MG | Freq: Two times a day (BID) | ORAL | Status: DC
Start: 2023-10-06 — End: 2023-10-07
  Administered 2023-10-07 (×2): 100 mg via ORAL

## 2023-10-06 MED ORDER — SODIUM CHLORIDE 0.9 % IV SOLN (MINI-BAG)
0.9 | Freq: Once | INTRAVENOUS | Status: AC
Start: 2023-10-06 — End: 2023-10-05
  Administered 2023-10-06: 04:00:00 5 10*6.[IU] via INTRAVENOUS

## 2023-10-06 MED ORDER — OXYTOCIN 0.06 UNIT/ML BOLUS FROM THE BAG (POST-PARTUM)
30500 | INTRAVENOUS | Status: AC | PRN
Start: 2023-10-06 — End: 2023-10-06
  Administered 2023-10-06: 16:00:00 166.7 [IU] via INTRAVENOUS

## 2023-10-06 MED ORDER — SENNA-DOCUSATE SODIUM 8.6-50 MG PO TABS
8.6-50 MG | ORAL_TABLET | Freq: Every day | ORAL | 1 refills | Status: DC
Start: 2023-10-06 — End: 2023-11-25
  Filled 2023-10-06: qty 60, 60d supply, fill #0

## 2023-10-06 MED ORDER — GLUCAGON HCL (DIAGNOSTIC) 1 MG IJ SOLR
1 MG | INTRAMUSCULAR | Status: AC | PRN
Start: 2023-10-06 — End: 2023-10-06

## 2023-10-06 MED ORDER — LACTATED RINGERS IV BOLUS
INTRAVENOUS | Status: AC | PRN
Start: 2023-10-06 — End: 2023-10-07

## 2023-10-06 MED ORDER — MAGNESIUM HYDROXIDE 400 MG/5ML PO SUSP
4005 MG/5ML | Freq: Every day | ORAL | Status: AC | PRN
Start: 2023-10-06 — End: 2023-10-06

## 2023-10-06 MED ORDER — IBUPROFEN 600 MG PO TABS
600 | Freq: Four times a day (QID) | ORAL | Status: DC
Start: 2023-10-06 — End: 2023-10-06

## 2023-10-06 MED ORDER — NORMAL SALINE FLUSH 0.9 % IV SOLN
0.9 % | Freq: Two times a day (BID) | INTRAVENOUS | Status: DC
Start: 2023-10-06 — End: 2023-10-07
  Administered 2023-10-07: 01:00:00 10 mL via INTRAVENOUS

## 2023-10-06 MED ORDER — RHO D IMMUNE GLOBULIN 1500 UNITS IM (WRAPPER)
1500 | Freq: Once | Status: AC | PRN
Start: 2023-10-06 — End: 2023-10-07

## 2023-10-06 MED ORDER — OXYTOCIN 30 UNITS IN 500 ML INFUSION
30500 UNIT/500ML | INTRAVENOUS | Status: DC
Start: 2023-10-06 — End: 2023-10-07
  Administered 2023-10-06: 14:00:00 1 m[IU]/min via INTRAVENOUS

## 2023-10-06 MED ORDER — NALOXONE HCL 0.4 MG/ML IJ SOLN
0.4 MG/ML | INTRAMUSCULAR | Status: DC | PRN
Start: 2023-10-06 — End: 2023-10-06

## 2023-10-06 MED ORDER — DOCUSATE SODIUM 100 MG PO CAPS
100 | Freq: Two times a day (BID) | ORAL | Status: DC
Start: 2023-10-06 — End: 2023-10-06

## 2023-10-06 MED ORDER — PENICILLIN G POTASSIUM IN NS 2500000 UNIT/100 ML IVPB
2500000 unit | INTRAVENOUS | Status: AC
Start: 2023-10-06 — End: 2023-10-06
  Administered 2023-10-06 (×2): 2.5 10*6.[IU] via INTRAVENOUS

## 2023-10-06 MED ORDER — ACETAMINOPHEN 500 MG PO TABS
500 MG | Freq: Four times a day (QID) | ORAL | Status: DC
Start: 2023-10-06 — End: 2023-10-07
  Administered 2023-10-06 – 2023-10-07 (×3): 1000 mg via ORAL

## 2023-10-06 MED ORDER — ONDANSETRON 4 MG PO TBDP
4 MG | Freq: Four times a day (QID) | ORAL | Status: DC | PRN
Start: 2023-10-06 — End: 2023-10-07

## 2023-10-06 MED ORDER — ONDANSETRON 4 MG PO TBDP
4 MG | Freq: Four times a day (QID) | ORAL | Status: AC | PRN
Start: 2023-10-06 — End: 2023-10-06

## 2023-10-06 MED FILL — PENICILLIN G POTASSIUM IN NS 2500000 UNIT/100 ML IVPB: 2500000 unit | INTRAVENOUS | Qty: 100

## 2023-10-06 MED FILL — ACETAMINOPHEN EXTRA STRENGTH 500 MG PO TABS: 500 MG | ORAL | Qty: 2

## 2023-10-06 MED FILL — MORPHINE SULFATE 4 MG/ML IJ SOLN: 4 mg/mL | INTRAMUSCULAR | Qty: 1

## 2023-10-06 MED FILL — MISOPROSTOL 25 MCG PRE-SPLIT TABLET: 25 mcg | ORAL | Qty: 1

## 2023-10-06 MED FILL — PENICILLIN G POTASSIUM 5000000 UNITS IJ SOLR: 5000000 units | INTRAMUSCULAR | Qty: 5

## 2023-10-06 MED FILL — ONDANSETRON HCL 4 MG/2ML IJ SOLN: 42 MG/2ML | INTRAMUSCULAR | Qty: 2

## 2023-10-06 MED FILL — IBUPROFEN 600 MG PO TABS: 600 MG | ORAL | Qty: 1

## 2023-10-06 MED FILL — MORPHINE SULFATE 10 MG/ML IV SOLN: 10 mg/mL | INTRAVENOUS | Qty: 1

## 2023-10-06 MED FILL — DERMOPLAST 20-0.5 % EX AERO: 20-0.5 % | CUTANEOUS | Qty: 85

## 2023-10-06 MED FILL — OXYTOCIN 30 UNITS IN 500 ML INFUSION: 30500 UNIT/500ML | INTRAVENOUS | Qty: 500

## 2023-10-06 MED FILL — ROPIVACAINE HCL 2 MG/ML IJ SOLN: INTRAMUSCULAR | Qty: 100

## 2023-10-06 NOTE — Other (Signed)
Patient up to bathroom and voided, pericare completed and new pads placed.

## 2023-10-06 NOTE — Progress Notes (Signed)
Labor Progress Note    Heather Huerta is a 43 y.o. female [phone] at [redacted]w[redacted]d  The patient was seen and examined. Her pain is well controlled. She reports fetal movement is present, complains of contractions, denies loss of fluid, denies vaginal bleeding.       Vital Signs:  Vitals:    10/06/23 0851 10/06/23 0853 10/06/23 0900 10/06/23 0901   BP: (!) 115/58   112/62   Pulse: 90   76   Resp: 16   16   Temp:       TempSrc:       SpO2:  100% 99%        FHT: 130, moderate variability, accelerations present, decelerations absent  Contractions: regular, every 5 minutes    Chaperone for Intimate Exam: Chaperone was present for entire exam, Chaperone Name: Insurance risk surveyor, RN  Cervical Exam: 5 cm dilated, 70 effaced, -1 station  Pitocin: @ 1 mu/min    Membranes: Ruptured clear fluid  Scalp Electrode in place: absent  Intrauterine Pressure Catheter in Place: absent    Interventions: SVE, AROM (clr)    Assessment/Plan:  Heather Huerta is a 43 y.o. female [phone] at [redacted]w[redacted]d admitted for IOL 2/2 GDMA2   - GBS positive, Pen G for GBS prophylaxis   - VSS, afebrile   - CEFM/TOCO showing cat 1 tracing   - AROM (clr) @ 0905   - Epidural in place   - S/p foley   - S/p Cytotec PV x1   - S/p Morphine/Zofran x1   - Pit per protocol   - BG check q4h   - Continue to monitor    Attending updated and in agreement with plan    Ephriam Knuckles, MD  Ob/Gyn Resident  10/06/2023, 9:11 AM

## 2023-10-06 NOTE — Other (Signed)
Patient to 7c per w/c baby in moms arms

## 2023-10-06 NOTE — Consults (Signed)
See previous note

## 2023-10-06 NOTE — Progress Notes (Signed)
Labor Progress Note    Heather Huerta is a 43 y.o. female [phone] at [redacted]w[redacted]d  The patient was seen and examined. She is feeling more pressure. She reports fetal movement is present, complains of contractions, complains of loss of fluid, denies vaginal bleeding.       Vital Signs:  Vitals:    10/06/23 0931 10/06/23 0935 10/06/23 1001 10/06/23 1005   BP: 122/64  101/61    Pulse: 76  77    Resp: 16  18    Temp:       TempSrc:       SpO2:  100%  99%         FHT: 130, moderate variability, accelerations present, occasional early decelerations   Contractions: regular 3 minutes    Chaperone for Intimate Exam: Chaperone was present for entire exam, Chaperone Name: Misty Stanley, RN  Cervical Exam: 9 cm dilated, 90 effaced, 0 station  Pitocin: @ 4 mu/min-turned off now    Membranes: AROM clear fluid at 0907  Scalp Electrode in place: absent  Intrauterine Pressure Catheter in Place: absent    Interventions: SVE    Assessment/Plan:  Heather Huerta is a 43 y.o. female [phone] at [redacted]w[redacted]d admitted for IOL 2/2 GDMA2   - GBS positive, Pen G for GBS prophylaxis   - VSS, afberile   -SVE: 9/90/0   -cEFM/TOCO:    -Epidural in place and functioning   -s/p Cytotec 25 mcg PV x1, foley balloon, AROM clear fluid at 0905   -Pitocin per protocol   -Continue current management    Attending updated and in route to hospital    Tyrone Nine, DO  Ob/Gyn Resident  10/06/2023, 10:28 AM

## 2023-10-06 NOTE — Progress Notes (Signed)
Labor Progress Note    Heather Huerta is a 43 y.o. female [phone] at [redacted]w[redacted]d  The patient was seen and examined. Her pain is well controlled. She reports fetal movement is present, denies contractions, denies loss of fluid, denies vaginal bleeding.       Vital Signs:  Vitals:    10/05/23 2120 10/06/23 0028   BP: (!) 117/59 116/62   Pulse: 89 75   Resp: 16    Temp: 98 F (36.7 C)    TempSrc: Oral    SpO2: 100%      FHT:  125 , moderate variability, accelerations present, decelerations absent  Contractions: irregular    Chaperone for Intimate Exam: Chaperone was present for entire exam, Chaperone Name: IT sales professional, RN  Cervical Exam: deferred until after epidural  Pitocin: @ 0 mu/min    Membranes: Intact  Scalp Electrode in place: absent  Intrauterine Pressure Catheter in Place: absent    Interventions: SVE, foley removal with gentle traction    Assessment/Plan:  Heather Huerta is a 43 y.o. female [phone] at [redacted]w[redacted]d admitted for IOL 2/2 GDMA2   - GBS negative, No indication for GBS prophylaxis   - VSS, afebrile   - cEFM/TOCO   - S/p Cytotec 25 PV x1, next @ 0420   - Foley balloon removed with gentle traction   - Patient desires epidural then AROM   - Pitocin ordered to start at 0430   - Continue to monitor closely     Attending updated and in agreement with plan    Ok Edwards, MD  Ob/Gyn Resident  10/06/2023, 4:22 AM

## 2023-10-06 NOTE — Care Coordination-Inpatient (Signed)
CASE MANAGEMENT POST-PARTUM TRANSITIONAL CARE PLAN    [redacted] weeks gestation of pregnancy [Z3A.38]    OB Provider: Dr Fransico Michael met w/ Dr Windell Hummingbird at her bedside to discuss DCP. She is S/P SVD on 10/06/2023    Writer verified address,her phone number and emergency contacts are all correct on facesheet.. She states she lives with her husband and three daughters. She denied barriers with transportation home, to doctor's appointments or with paying for medications upon discharge home.     [employer] primary and [employer] secondary insurance correct. Writer notified Dr Windell Hummingbird that she has 30 days from date of birth to add newborn to insurance policy. She verbalized that her husband would likely be adding the baby to his insurance [facility]) and she would let him know.  His name: Alycia Patten - DOB 04/13/1972    Infant name on BC: undecided as yet.   Infant PCP Dr Jake Church.     DME: has gDM supplies  HOME CARE: no    Anticipated DC of mother and infant 1-2 days after SVD.     [facility] RISK OF UNPLANNED READMISSION 2.0             3 Total Score

## 2023-10-06 NOTE — Progress Notes (Signed)
Labor Progress Note    Heather Huerta is a 43 y.o. female [phone] at [redacted]w[redacted]d  The patient was seen and examined. Her pain is well controlled. She reports fetal movement is present, complains of contractions, complains of loss of fluid, denies vaginal bleeding.       Vital Signs:  Vitals:    10/06/23 0901 10/06/23 0915 10/06/23 0931 10/06/23 0935   BP: 112/62 123/63 122/64    Pulse: 76 80 76    Resp: 16  16    Temp:       TempSrc:       SpO2:  99%  100%       FHT: 130, moderate variability, accelerations present, intermittent early decelerations   Contractions: regular, every 5 minutes    Chaperone for Intimate Exam: Chaperone was present for entire exam, Chaperone Name: Insurance risk surveyor, RN  Cervical Exam: 6 cm dilated, 70 effaced, -1 station  Pitocin: @ 2 mu/min    Membranes: Ruptured clear fluid  Scalp Electrode in place: absent  Intrauterine Pressure Catheter in Place: absent    Interventions: SVE    Assessment/Plan:  Heather Huerta is a 43 y.o. female [phone] at [redacted]w[redacted]d admitted for IOL 2/2 GDMA2   - GBS positive, Pen G for GBS prophylaxis   - VSS, afebrile   - CEFM/TOCO showing cat 1 tracing   - AROM (clr) @ 0905   - Epidural in place   - S/p foley   - S/p Cytotec PV x1   - S/p Morphine/Zofran x1   - Pit per protocol   - BG check q2h   - Continue to monitor    Attending updated and in agreement with plan    Ephriam Knuckles, MD  Ob/Gyn Resident  10/06/2023, 10:09 AM

## 2023-10-06 NOTE — Other (Signed)
Foley balloon out with gentle traction.

## 2023-10-06 NOTE — Anesthesia Procedure Notes (Signed)
Epidural Block    Patient location during procedure: OB  Start time: 10/06/2023 8:23 AM  Reason for block: labor epidural  Staffing  Performed: resident/CRNA   Anesthesiologist: Gailen Shelter, MD  Resident/CRNA: Starr Sinclair, APRN - CRNA  Performed by: Starr Sinclair, APRN - CRNA  Authorized by: Gailen Shelter, MD    Epidural  Patient position: sitting  Prep: Betadine  Patient monitoring: frequent blood pressure checks and continuous pulse ox  Approach: midline  Location: L3-4  Injection technique: LOR air  Guidance: paresthesia technique  Provider prep: mask and sterile gloves  Needle  Needle type: Tuohy   Needle gauge: 17 G  Needle length: 3.5 in  Needle insertion depth: 6 cm  Catheter type: multi-orifice  Catheter size: 19 G  Catheter at skin depth: 13 cm  Test dose: negativeCatheter Secured: tegaderm and tape  Assessment  Sensory level: T10  Hemodynamics: stable  Attempts: 1  Outcomes: uncomplicated and patient tolerated procedure well  Preanesthetic Checklist  Completed: patient identified, IV checked, site marked, risks and benefits discussed, surgical/procedural consents, equipment checked, pre-op evaluation, timeout performed, anesthesia consent given, oxygen available, monitors applied/VS acknowledged, fire risk safety assessment completed and verbalized and blood product R/B/A discussed and consented

## 2023-10-06 NOTE — Consults (Signed)
Mom has breastfeeding experience.  Visitors at bedside, pt wishes to talk later.   Education booklet given.

## 2023-10-06 NOTE — Anesthesia Pre-Procedure Evaluation (Signed)
 Department of Anesthesiology  Preprocedure Note       Name:  Heather Huerta   Age:  43 y.o.  DOB:  [birth year]                                          MRN:  [id number]         Date:  10/06/2023      Surgeon: * No surgeons listed *    Procedure: * No procedures listed *    Medications prior to admission:   Prior to Admission medications    Medication Sig Start Date End Date Taking? Authorizing Provider   metFORMIN (GLUCOPHAGE-XR) 500 MG extended release tablet Take 1 tablet by mouth in the morning and at bedtime 09/24/23  Yes Ladona Mow, MD   famotidine (PEPCID) 20 MG tablet Take 1 tablet by mouth 2 times daily 04/30/23  Yes Beam, Zenaida Deed, APRN - CNP   aspirin (ASPIRIN CHILDRENS) 81 MG chewable tablet Take 1 tablet by mouth daily 04/09/23  Yes Beam, Zenaida Deed, APRN - CNP   Prenatal Vit-Fe Fumarate-FA (PRENATAL VITAMIN) 27-0.8 MG TABS Take 1 tablet by mouth daily 03/18/23  Yes Polce, Jodelle Gross, APRN - NP   Alcohol Swabs (ALCOHOL PREP) 70 % PADS 1 each by Does not apply route in the morning, at noon, in the evening, and at bedtime 08/11/23   Beam, Zenaida Deed, APRN - CNP   blood glucose monitor strips Test 4 times a day & as needed for symptoms of irregular blood glucose. Dispense sufficient amount for indicated testing frequency plus additional to accommodate PRN testing needs. 08/11/23   Beam, Zenaida Deed, APRN - CNP   glucose monitoring kit 1 kit by Does not apply route in the morning, at noon, in the evening, and at bedtime 08/11/23   Beam, Zenaida Deed, APRN - CNP   FreeStyle Lancets MISC 1 each by Does not apply route 4 times daily 08/11/23   Beam, Zenaida Deed, APRN - CNP       Current medications:    Current Facility-Administered Medications   Medication Dose Route Frequency Provider Last Rate Last Admin    ROPivacaine (NAROPIN) 0.2% injection 0.2%             oxytocin (PITOCIN) 30 units in 500 mL infusion  1-20 milli-units/min IntraVENous Continuous Cenac, Leshae, MD        naloxone 0.4 mg in 10 mL sodium chloride syringe   IntraVENous  PRN Gailen Shelter, MD        ondansetron [facility]) injection 4 mg  4 mg IntraVENous Q6H PRN Gailen Shelter, MD        ROPivacaine 0.2% in sodium chloride 0.9% (OB)  10 mL/hr Epidural Continuous Gailen Shelter, MD        ePHEDrine injection 10 mg  10 mg IntraVENous Once PRN Gailen Shelter, MD        Followed by    Melene Muller ON 10/07/2023] ePHEDrine injection 5 mg  5 mg IntraVENous PRN Gailen Shelter, MD        lactated ringers bolus 500 mL  500 mL IntraVENous PRN Ojukwu, Emmanuella, MD        Or    lactated ringers bolus 500 mL  500 mL IntraVENous PRN Ojukwu, Emmanuella, MD        sodium chloride flush 0.9 % injection 5-40 mL  5-40 mL IntraVENous 2  times per day Elliot Dally, MD   10 mL at 10/05/23 2150    0.9 % sodium chloride infusion   IntraVENous PRN Ojukwu, Emmanuella, MD        ondansetron (ZOFRAN) injection 4 mg  4 mg IntraVENous Q6H PRN Ojukwu, Emmanuella, MD        Or    ondansetron (ZOFRAN-ODT) disintegrating tablet 4 mg  4 mg Oral Q6H PRN Ojukwu, Emmanuella, MD        diphenhydrAMINE (BENADRYL) tablet 25 mg  25 mg Oral Q4H PRN Ojukwu, Emmanuella, MD        Or    diphenhydrAMINE (BENADRYL) injection 25 mg  25 mg IntraVENous Q4H PRN Ojukwu, Emmanuella, MD        oxytocin (PITOCIN) 30 units in 500 mL infusion  87.3 milli-units/min IntraVENous Continuous PRN Ojukwu, Emmanuella, MD        And    oxytocin (PITOCIN) 10 unit bolus from the bag  10 Units IntraVENous PRN Ojukwu, Emmanuella, MD        acetaminophen (TYLENOL) tablet 1,000 mg  1,000 mg Oral Q6H PRN Ojukwu, Emmanuella, MD        witch hazel-glycerin (TUCKS) pad   Topical PRN Ojukwu, Emmanuella, MD        benzocaine-menthol (DERMOPLAST) 20-0.5 % spray   Topical PRN Ojukwu, Emmanuella, MD        simethicone (MYLICON) chewable tablet 80 mg  80 mg Oral Q6H PRN Ojukwu, Emmanuella, MD        magnesium hydroxide (MILK OF MAGNESIA) 400 MG/5ML suspension 30 mL  30 mL Oral Daily PRN Ojukwu, Emmanuella, MD        penicillin G potassium 2.5 million  units in 0.9% sodium chloride 100 mL IVPB  2.5 Million Units IntraVENous Q4H Elliot Dally, MD 200 mL/hr at 10/06/23 0731 2.5 Million Units at 10/06/23 0731    insulin lispro (HUMALOG,ADMELOG) injection vial 0-8 Units  0-8 Units SubCUTAneous 4x Daily PRN Ojukwu, Donzetta Starch, MD        glucose chewable tablet 16 g  4 tablet Oral PRN Ojukwu, Emmanuella, MD        dextrose bolus 10% 125 mL  125 mL IntraVENous PRN Ojukwu, Emmanuella, MD        Or    dextrose bolus 10% 250 mL  250 mL IntraVENous PRN Ojukwu, Emmanuella, MD        glucagon injection 1 mg  1 mg SubCUTAneous PRN Ojukwu, Emmanuella, MD        dextrose 10 % infusion   IntraVENous Continuous PRN Ojukwu, Emmanuella, MD        0.9 % sodium chloride infusion   IntraVENous Continuous Cenac, Leshae, MD 125 mL/hr at 10/06/23 0606 Rate Verify at 10/06/23 0606    metFORMIN (GLUCOPHAGE-XR) extended release tablet 500 mg  500 mg Oral BID Ok Edwards, MD           Allergies:    Allergies   Allergen Reactions    Seasonal        Problem List:    Patient Active Problem List   Diagnosis Code    Nasal polyp J33.9    Asymptomatic bacteriuria during pregnancy O99.891, R82.71    Antepartum multigravida of advanced maternal age O62.529    GBS (group B Streptococcus carrier), +RV culture, currently pregnant O99.820    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    Past Surgical History:        Procedure Laterality Date    INTRAUTERINE DEVICE INSERTION N/A 08/09/2018    MIrena insertion NDC [id number]       Social History:    Social History     Tobacco Use    Smoking status: Never     Passive exposure: Never    Smokeless tobacco: Never   Substance Use Topics    Alcohol use: Not Currently                                 Counseling given: Not Answered      Vital Signs (Current):   Vitals:    10/05/23 2120 10/06/23 0028 10/06/23 0727   BP: (!) 117/59 116/62 120/71   Pulse: 89 75 80   Resp: 16  16   Temp: 36.7 C (98 F)     TempSrc: Oral     SpO2: 100%                                                BP Readings from Last 3 Encounters:   10/06/23 120/71   10/01/23 109/73   09/29/23 119/66       NPO Status:                                                                                 BMI:   Wt Readings from Last 3 Encounters:   10/01/23 100.7 kg (222 lb)   09/24/23 99.3 kg (219 lb)   09/24/23 98.9 kg (218 lb)     There is no height or weight on file to calculate BMI.    CBC:   Lab Results   Component Value Date/Time    WBC 6.9 10/05/2023 10:06 PM    RBC 3.92 10/05/2023 10:06 PM    HGB 11.8 10/05/2023 10:06 PM    HCT 34.9 10/05/2023 10:06 PM    MCV 89.0 10/05/2023 10:06 PM    RDW 14.6 10/05/2023 10:06 PM    PLT 162 10/05/2023 10:06 PM       CMP:   Lab Results   Component Value Date/Time    NA 136 07/03/2023 07:11 PM    K 3.7 07/03/2023 07:11 PM    CL 105 07/03/2023 07:11 PM    CO2 20 07/03/2023 07:11 PM    BUN 6 07/03/2023 07:11 PM    CREATININE 0.6 07/03/2023 07:11 PM    LABGLOM >90 07/03/2023 07:11 PM    LABGLOM 81 12/14/2022 02:25 PM    GLUCOSE 106 07/03/2023 07:11 PM    CALCIUM 9.0 07/03/2023 07:11 PM    BILITOT 0.3 12/14/2022 02:25 PM    ALKPHOS 44 12/14/2022 02:25 PM    AST 24 12/14/2022 02:25 PM    ALT 9 12/14/2022 02:25 PM  POC Tests:   Recent Labs     10/06/23  0559   POCGLU 89       Coags: No results found for: "PROTIME", "INR", "APTT"    HCG (If Applicable):   Lab Results   Component Value Date    HCGQUANT 32,175.0 (H) 02/24/2023        ABGs: No results found for: "PHART", "PO2ART", "PCO2ART", "HCO3ART", "BEART", "O2SATART"     Type & Screen (If Applicable):  Lab Results   Component Value Date    ABORH B POSITIVE 10/05/2023    LABANTI NEGATIVE 10/05/2023       Drug/Infectious Status (If Applicable):  Lab Results    Component Value Date/Time    HEPCAB NONREACTIVE 04/08/2023 08:09 AM       COVID-19 Screening (If Applicable):   Lab Results   Component Value Date/Time    COVID19 Not Detected 04/08/2020 09:23 AM           Anesthesia Evaluation  Patient summary reviewed and Nursing notes reviewed  Airway: Mallampati: II          Dental: normal exam         Pulmonary:Negative Pulmonary ROS and normal exam                               Cardiovascular:          ECG reviewed                        Neuro/Psych:               GI/Hepatic/Renal:             Endo/Other:    (+) Diabetes.                 Abdominal: normal exam            Vascular: negative vascular ROS.         Other Findings:             Anesthesia Plan      epidural     ASA 3             Anesthetic plan and risks discussed with patient.    Use of blood products discussed with patient whom consented to blood products.                      Starr Sinclair, APRN - CRNA   10/06/2023

## 2023-10-06 NOTE — Progress Notes (Signed)
Labor Progress Note    Heather Huerta is a 43 y.o. female [phone] at [redacted]w[redacted]d  The patient was seen and examined. Her pain is well controlled. She reports fetal movement is present, denies contractions, denies loss of fluid, denies vaginal bleeding.       Vital Signs:  Vitals:    10/05/23 2120   Resp: 16   Temp: 98 F (36.7 C)   TempSrc: Oral   SpO2: 100%     FHT:  135 , moderate variability, accelerations present, decelerations absent  Contractions: none    Chaperone for Intimate Exam: Chaperone was present for entire exam, Chaperone Name: IT sales professional, RN  Cervical Exam: 2 cm dilated, 50 effaced, -2 station  Pitocin: @ 0 mu/min    Membranes: Intact  Scalp Electrode in place: absent  Intrauterine Pressure Catheter in Place: absent    Interventions: SVE, foley balloon and vaginal Cytotec placement    Assessment/Plan:  Heather Huerta is a 43 y.o. female [phone] at [redacted]w[redacted]d admitted for IOL 2/2 GDMA2    - GBS positive, Pen G for GBS prophylaxis   - VSS, afebrile   - cEFM/TOCO   - S/p Cytotec 25 PV x1, next @ 0420   - Foley balloon in place, out @ 1220    - S/p morphine/Zofran x1    - Plan for epidural after foley comes out and then AROM once safe to do so thereafter   - Continue to monitor closely     Attending updated and in agreement with plan    Ok Edwards, MD  Ob/Gyn Resident  10/06/2023, 1:05 AM

## 2023-10-06 NOTE — Care Coordination-Inpatient (Signed)
ANTEPARTUM NOTE    [redacted] weeks gestation of pregnancy [Z3A.38]    Heather Huerta was admitted to L&D on 10/05/2023 for scheduled IOL @ 38 6/7    OB GYN Provider: Dr Olevia Bowens    Will meet with patient after delivery to verify name/address/phone/insurance and discuss discharge planning.     Anticipate DC home 2 nights after vaginal delivery or 4 nights after C/S delivery as long as hemodynamically stable.

## 2023-10-06 NOTE — Other (Signed)
0810 at bedside.  Epidural procedure explained, risks discussed.  Pt verbalizes consent for epidural.   0813 patient positioned for epidural.0813 Time out completed.   1914 catheter placed. 7829 test dose given, HR 83.  Epidural catheter taped and secured per anesthesia.   0830  to low fowlers with left uterine displacement. 0835 loading dose given. 0835 pump initiated.  Pt tolerated procedure well.

## 2023-10-06 NOTE — Other (Signed)
Dr Olevia Bowens Dr Caesar Chestnut bedside

## 2023-10-06 NOTE — Other (Addendum)
Anesthesia updated patient would like epidural.

## 2023-10-06 NOTE — Other (Signed)
Patient actively pushing. RN remains in continuous attendance at the bedside. Assessment and evaluation of fetal heart rate ongoing via continuous EFM.

## 2023-10-06 NOTE — Anesthesia Post-Procedure Evaluation (Signed)
Department of Anesthesiology  Postprocedure Note    Patient: Heather Huerta  MRN: [id number]  Birthdate: [birth year]  Date of evaluation: 10/07/2023    Procedure Summary       Date: 10/06/23 Room / Location:     Anesthesia Start: 0810 Anesthesia Stop: 1112    Procedure: Labor Analgesia Diagnosis:     Scheduled Providers:  Responsible Provider: Gailen Shelter, MD    Anesthesia Type: epidural ASA Status: 3            Anesthesia Type: No value filed.    Aldrete Phase I:      Aldrete Phase II:      Anesthesia Post Evaluation    Patient location during evaluation: PACU  Patient participation: complete - patient participated  Level of consciousness: awake  Airway patency: patent  Nausea & Vomiting: no nausea and no vomiting  Cardiovascular status: blood pressure returned to baseline  Respiratory status: acceptable  Hydration status: euvolemic  Comments: LMP 01/06/2023 (Exact Date)        Pain management: adequate        No notable events documented.

## 2023-10-07 LAB — HEMOGLOBIN AND HEMATOCRIT
Hematocrit: 35.7 % — ABNORMAL LOW (ref 36.3–47.1)
Hemoglobin: 11.7 g/dL — ABNORMAL LOW (ref 11.9–15.1)

## 2023-10-07 MED ORDER — CYCLOBENZAPRINE HCL 5 MG PO TABS
5 | ORAL_TABLET | Freq: Two times a day (BID) | ORAL | 0 refills | Status: AC | PRN
Start: 2023-10-07 — End: 2023-10-17

## 2023-10-07 MED ORDER — CYCLOBENZAPRINE HCL 10 MG PO TABS
10 MG | Freq: Three times a day (TID) | ORAL | Status: DC | PRN
Start: 2023-10-07 — End: 2023-10-07
  Administered 2023-10-07: 11:00:00 10 mg via ORAL

## 2023-10-07 MED ORDER — POLYETHYLENE GLYCOL 3350 17 G PO PACK
17 g | Freq: Every day | ORAL | Status: DC
Start: 2023-10-07 — End: 2023-10-07

## 2023-10-07 MED ORDER — SENNA-DOCUSATE SODIUM 8.6-50 MG PO TABS
8.6-50 MG | Freq: Every day | ORAL | Status: DC | PRN
Start: 2023-10-07 — End: 2023-10-07
  Administered 2023-10-07: 04:00:00 2 via ORAL

## 2023-10-07 MED ORDER — MAGNESIUM HYDROXIDE 400 MG/5ML PO SUSP
4005 MG/5ML | Freq: Every day | ORAL | Status: DC | PRN
Start: 2023-10-07 — End: 2023-10-07

## 2023-10-07 MED FILL — IBUPROFEN 600 MG PO TABS: 600 MG | ORAL | Qty: 1

## 2023-10-07 MED FILL — DERMOPLAST 20-0.5 % EX AERO: 20-0.5 % | CUTANEOUS | Qty: 85

## 2023-10-07 MED FILL — SENNOSIDES-DOCUSATE SODIUM 8.6-50 MG PO TABS: 8.6-50 MG | ORAL | Qty: 2

## 2023-10-07 MED FILL — ACETAMINOPHEN EXTRA STRENGTH 500 MG PO TABS: 500 MG | ORAL | Qty: 2

## 2023-10-07 MED FILL — DOCUSATE SODIUM 100 MG PO CAPS: 100 MG | ORAL | Qty: 1

## 2023-10-07 MED FILL — CYCLOBENZAPRINE HCL 10 MG PO TABS: 10 MG | ORAL | Qty: 1

## 2023-10-07 MED FILL — METFORMIN HCL ER 500 MG PO TB24: 500 MG | ORAL | Qty: 1

## 2023-10-07 NOTE — Care Coordination-Inpatient (Signed)
Discharge Report    [facility]  Clinical Case Management Department  Written by: Tana Conch, RN    Patient Name: Heather Huerta  Attending Provider: Para Skeans*  Admit Date: 10/05/2023  8:32 PM  MRN: [id number]  Account: [id number]                     DOB: 01/18/1981  Discharge Date: 10/07/2023    Disposition: home    Tana Conch, RN

## 2023-10-07 NOTE — Lactation Note (Signed)
Mom reports nipple pain with feeds. Lanolin and gel pads given and instructed on use. Reviewed position, deep latch, and drawing the lips out when baby is at the breast. Encouraged mom to call out when baby alerts for a feeding.

## 2023-10-07 NOTE — Other (Signed)
Discharge teaching and instructions completed. AVS reviewed. Understanding verbalized.  Printed prescriptions filled by [facility] outpatient pharmacy.  Patient voiced understanding regarding prescriptions, follow up appointments, and care of self at home. Patient to main lobby with all belongings and FOB. Infant carried in car seat by FOB.

## 2023-10-07 NOTE — Progress Notes (Addendum)
CLINICAL PHARMACY NOTE: MEDS TO BEDS    Total # of Prescriptions Filled: 1   The following medications were delivered to the patient:  Senexon- s    Additional Documentation:  patient did not want ibuprofen or acetaminophen (has at home), clover payment

## 2023-10-07 NOTE — Progress Notes (Addendum)
POST PARTUM DAY # 1    Heather Huerta is a 43 y.o. female  This patient was seen & examined today.     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 Her lochia is light. She denies chest pain, shortness of breath, headache, lightheadedness, blurred vision, peripheral edema, and palpitations. She is both bottle and breast feeding and she denies any breast tenderness. She is ambulating well. Her voiding pattern is normal. I reviewed signs and symptoms of post partum depression with the patient, she currently denies any of these symptoms. She is tolerating solids.     Vital Signs:  Vitals:    10/06/23 1327 10/06/23 1355 10/06/23 2019 10/07/23 0038   BP: (!) 107/49 (!) 125/58 (!) 110/54 115/71   Pulse: 86 82 65 74   Resp:  18 16 17    Temp: 98.2 F (36.8 C) 98.4 F (36.9 C) 97.9 F (36.6 C) 98.2 F (36.8 C)   TempSrc: Oral Oral Oral Oral   SpO2:  99% 98% 98%     Physical Exam:  General:  no apparent distress, alert, and cooperative  Neurologic:  alert, oriented, normal speech, no focal findings or movement disorder noted  Lungs:  No increased work of breathing noted  Heart:  regular rate and rhythm    Abdomen: abdomen soft, non-distended, non-tender  Fundus: non-tender, normal size, firm, below umbilicus  Extremities:  no calf tenderness, non edematous    Lab:  Lab Results   Component Value Date    HGB 11.8 (L) 10/05/2023     Lab Results   Component Value Date    HCT 34.9 (L) 10/05/2023      Assessment/Plan:  Heather Huerta is a [id number] PPD # 1 s/p SVD   - Doing well, VSS   - female infant in General Care Nursery   - Encourage ambulation   - Postpartum Hgb/Hct if patient experiences any s/s anemia   - Pain regimen: Motrin and tylenol for pain. Flexeril added for additional pain control  Rh positive/Rubella immune   - Rhogam/MMR not indicated at this time  both breast and bottle    - Denies s/s of mastitis  GDMA2  - Patient on Metformin 500 mg BID XR during pregnancy  - Fasting blood glucose ordered per primary OB attending preference  - Will need 6-12 week GTT  BMI 31  Continue post partum care    Counseling Completed:  Secondary Smoke risks and Sudden Infant Death Syndrome were reviewed with recommendations. Infant sleeping, "back to sleep" and avoidance of co-sleeping recommendations were reviewed.  Signs and Symptoms of Post Partum Depression were reviewed. The patient is to call if any occur.  Signs and symptoms of Mastitis were reviewed. The patient is to call if any occur for follow up.  Discharge instructions including pelvic rest, no driving with pain medicine and office follow-up were reviewed  with patient     Attending Physician: Dr. Hurshel Keys, MD  Ob/Gyn Resident   10/07/2023, 2:30 AM          Attending Physician Statement  I have discussed the care of Heather Huerta, including pertinent history and exam findings,  with the resident. I have seen and examined the patient and the key elements of all parts of the encounter have been performed by me.  I agree with the assessment, plan and orders as documented by the resident.  (GC Modifier)    Anselm Jungling, DO

## 2023-10-07 NOTE — Lactation Note (Signed)
Mom called out for assistance with the feed. Refined position and helped with a deeper latch. After repeated attempts mom noted it was still painful. Baby appears to have a fairly tight lip tie and a high palate. Taught mom how to place the 24 mm nipple shield on breast. Baby latched and mom note it was more comfortable. Reviewed feeding patterns as well as how to know baby is getting to the supply well.

## 2023-10-07 NOTE — Discharge Summary (Signed)
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 female G5P4014 at [redacted]w[redacted]d admitted for IOL 2/2 GDMA2 @ [redacted]w[redacted]d; received Cytotec 25 PV x1, Morphine/zofran x1, foley balloon, epidural, pitocin, AROM (clr).     She delivered by spontaneous vaginal a Live Born infant on 2.       Information for the patient's newborn:  Heather, Eardley Girl Huerta [[id number]]   female   Birth Weight: 3.61 kg (7 lb 15.3 oz)    Apgars: 8 at 1 minute and 9 at 5 minutes.     Postpartum course: normal.    PPD#1: Hgb 11.7    Course of patient: uncomplicated    Discharge to: Home    Readmission planned: no     Indication for 6 week PP 2 hour GTT?: Yes, GDMA2    Eligible for 2 week PP virtual visit? no - private      Recommendations on Discharge:     Medications:      Medication List        START taking these medications      acetaminophen 500 MG tablet  Commonly known as: TYLENOL  Take 2 tablets by mouth every 6 hours as needed for Pain     ibuprofen 600 MG tablet  Commonly known  as: ADVIL;MOTRIN  Take 1 tablet by mouth every 6 hours as needed for Pain     sennosides-docusate sodium 8.6-50 MG tablet  Commonly known as: SENOKOT-S  Take 1 tablet by mouth daily            CONTINUE taking these medications      Alcohol Prep 70 % Pads  1 each by Does not apply route in the morning, at noon, in the evening, and at bedtime     famotidine 20 MG tablet  Commonly known as: PEPCID  Take 1 tablet by mouth 2 times daily     FreeStyle Lancets Misc  1 each by Does not apply route 4 times daily     glucose monitoring kit  1 kit by Does not apply route in the morning, at noon, in the evening, and at bedtime     Prenatal Vitamin 27-0.8 MG Tabs  Take 1 tablet by mouth daily            STOP taking these medications      aspirin 81 MG chewable tablet  Commonly known as:  Aspirin Childrens     blood glucose test strips     metFORMIN 500 MG extended release tablet  Commonly known as: GLUCOPHAGE-XR               Where to Get Your Medications        These medications were sent to [facility] [place], [place] - [address] - [phone] [address] Ginsberg [phone]  [address], [place] [address]      Phone: [phone]   acetaminophen 500 MG tablet  ibuprofen 600 MG tablet  sennosides-docusate sodium 8.6-50 MG tablet         Activity: pelvic rest x 6 weeks, no lifting greater than 15 lbs  Diet: regular diet  Follow up: 2 weeks for Postpartum    Condition on discharge: stable    Discharge date: 10/07/23    Tyrone Nine, DO  Ob/Gyn Resident    Comments:  Home care and follow-up care were reviewed.  Pelvic rest, and birth control were reviewed. Signs and symptoms of mastitis and post partum depression were reviewed. The patient is to notify her physician if any of these occur. The patient was counseled on secondary smoke risks and the increased risk of sudden infant death syndrome and respiratory problems to her baby with exposure. She was counseled on various alternate recommendations to decrease the  exposure to secondary smoke to her children.

## 2023-10-09 LAB — SURGICAL PATHOLOGY REPORT

## 2023-10-26 ENCOUNTER — Encounter
Payer: PRIVATE HEALTH INSURANCE | Attending: Student in an Organized Health Care Education/Training Program | Primary: Student in an Organized Health Care Education/Training Program

## 2023-11-25 ENCOUNTER — Ambulatory Visit
Admit: 2023-11-25 | Discharge: 2023-11-25 | Payer: PRIVATE HEALTH INSURANCE | Attending: Women's Health | Primary: Student in an Organized Health Care Education/Training Program

## 2023-11-25 MED ORDER — NORETHINDRONE 0.35 MG PO TABS
0.35 | ORAL_TABLET | Freq: Every day | ORAL | 11 refills | Status: DC
Start: 2023-11-25 — End: 2024-03-14

## 2023-11-25 NOTE — Progress Notes (Signed)
 Heather Huerta is a 43 y.o. female    Delivery Information:  Delivery Date: 10/06/2023    Sex of Baby: female  Type of Delivery: Vaginal  Delivering Physician: dr Olevia Bowens    Post Partum Questions:  No Do you Smoke?  If yes PPD is   Yes Do you intake caffeine?  No Do you use street drugs?  No Do you consume alcohol?  Yes Are breast feeding?  No Are you bottle feeding?  No Are you having any problems with your breasts? (lumps, discharge, asymmetry, or redness)  No Are you having any problems with bowel movements or urination?  No Are you having any vaginal discharge/itching/ or burning?  Yes Are you getting help and support with the baby?  Yes Have you had intercourse since your delivery?  No If you had intercourse did you use condoms?  Yes Have you had a menstrual cycle since delivery?  Date: 11/16/2023  No Do you have any questions that need to be addressed today?    How long did you bleed after your delivery? 4 Weeks  What are your plans besides condoms for family planning? POP  Who lives at home with you and your baby?  Husband and children     The patient was counseled on the risks of tobacco abuse and the harm it may cause to the patient and her newborn baby as well as others in the household from secondary smoke exposure.  The patient was counseled on the risks of potential respiratory problems, cancers, and sudden infant death syndrome as well as other co-morbidities. These were discussed in detail. I reviewed cessation options and plans. The patient was counseled on smoking outdoors with appropriate covers of clothing and hair.  She was counseled on hand washing prior to holding or picking up the baby.    The patient had her questions answered in regards to the baby and was instructed to follow up with her pediatrician. She had reviewed with her safe sleep recommendations.    Vitals:    11/25/23 1607   BP: 110/82   BP Site: Right Upper Arm   Patient Position: Sitting   BP Cuff Size: Large Adult   Weight:  102.5 kg (226 lb)   Height: 1.778 m (5\' 10" )        Physical Exam:  Chaperone for Intimate Exam  Chaperone was offered and accepted as part of the rooming process.  Chaperone: NA       General Appearance:  This  is a well Developed, well Nourished, well groomed female.      Her BMI was reviewed. Nutritional decision making was discussed.    Skin:  There was a Normal Inspection of the skin without rashes or lesions.  There were no rashes.  (Papular, Maculopapular, Hives, Pustular, Macular)     There were no lesions (Ulcers, Erythema, Abn. Appearing Nevi)            Lymphatic:  No Lymph Nodes were Palpable in the neck , axilla or groin.  0 # Of Lymph Nodes; Location ; Character [Normal]  [Shotty] [Tender] [Enlarged]     Neck and EENT:  The neck was supple. There were no masses   The thyroid was not enlarged and had no masses.  Perrla, EOMI B/L, TMI B/L No Abnormalities.   Throat inspected-No exudates or Masses, Nares Patent No Masses        Respiratory:  The lungs were auscultated and found to be clear. There were no  rales, rhonchi or wheezes. There was a good respiratory effort.    Cardiovascular:  The heart was in a regular rate and rhythm. . No S3 or S4. There was no murmur appreciated. Location, grade, and radiation are not applicable.     Extremities:  The patients extremities were without calf tenderness, edema, or varicosities.  There was full range of motion in all four extremities. Pulses in all four extremities were appreciated and are 2/4.    Abdomen:  The abdomen was soft and non-tender. There were good bowel sounds in all quadrants and there was no guarding, rebound or rigidity.  On evaluation there was no evidence of hepatosplenomegaly and there was no costal vertebral angel tenderness bilaterally.  No hernias were appreciated.     Abdominal Scars: none    Psych:  The patient had a normal Orientation to: Time, Place, Person, and Situation  There is no Mood / Affect changes    Breast:   (Chest)  deferred  Self breast exams were reviewed in detail. Literature was given.    Pelvic Exam:  Exam deferred.    Rectal Exam:  exam declined by patient          Assessment:  1. Postpartum care and examination         Family planning was discussed    Signs and symptoms of mastitis were reviewed. The patient did not have any.    Signs and symptoms of post partum depression were discussed the patient did not have any.    Tobacco abuse and secondary smoke risks to the mother and her newborn baby were reviewed. Sudden infant death syndrome was discussed. Cessation and proper protections discussed in detail.     Counseling Hormonal Based Birth Control:      The patient was seen and counseled on all forms of birth control both female and female  reversible and non. She is aware that hormonal based birth control may increase her risk of developing a blood clot which may increase her morbidity and or mortality. She was counseled on alternate non hormonal based contraception options.  We discussed that smoking and any hormonal based contraception may increase the patients risks of developing these life threatening blood clots. All patients are encouraged to stop smoking at the time of contraceptive counseling.  Cessation programs were reviewed.    The patient was instructed to use barrier contraception for sexually transmitted disease prevention.  The patient was also informed of antibiotics decreasing contraceptive efficacy and the need for barrier contraception from the onset of her antibiotic dosing and through a minimum of thirty days from antibiotic cessation.    The life threatening side effect profile was reviewed in detail this includes but is not limited to shortness of breath, chest pain, severe or persistent headaches, or calf pain.  If any of these occur the patient has been instructed to stop using her hormonal based contraception, notify the office, and go to the emergency department or call  [phone].    Counseling was completed specifically regarding the use of Depot Provera Injections with over a 5 year exposure and the increased risks of developing brain tumors, (Meningioma's). Alternate options that do not carry this risk were reviewed in detail.    The patient denied any personal history of blood clots in her leg, lung, or heart and denied any family history of stroke, TIA, sudden cardiac death < 40 y.o.,pulmonary embolism, or deep venous thrombosis.      Plan:  Return in  about 4 months (around 03/26/2024) for annual.   Antibiotics and decreased efficacy with birth control reviewed  Barrier recommendations and STD counseling completed  S/S mastitis reviewed  HRT signed  Denies a personal or family hx of a blood clot to the leg/lung/brain  Rx POP      Patient was seen with total face to face time of 20 minutes. More than 50% of this visit was on counseling and education regarding her    Diagnosis Orders   1. Postpartum care and examination         and her options. She was also counseled on her preventative health maintenance recommendations and follow-up.          Electronically signed by Carollee Herter, APRN - NP on 11/25/23 at 4:29 PM EDT

## 2023-12-02 ENCOUNTER — Encounter
Payer: PRIVATE HEALTH INSURANCE | Attending: Student in an Organized Health Care Education/Training Program | Primary: Student in an Organized Health Care Education/Training Program

## 2024-02-04 ENCOUNTER — Inpatient Hospital Stay: Payer: PRIVATE HEALTH INSURANCE | Primary: Student in an Organized Health Care Education/Training Program

## 2024-02-04 ENCOUNTER — Ambulatory Visit
Admit: 2024-02-04 | Discharge: 2024-02-04 | Payer: PRIVATE HEALTH INSURANCE | Attending: Student in an Organized Health Care Education/Training Program | Primary: Student in an Organized Health Care Education/Training Program

## 2024-02-04 VITALS — BP 102/58 | HR 91 | Temp 97.10000°F | Resp 16 | Ht 65.0 in | Wt 233.6 lb

## 2024-02-04 DIAGNOSIS — E6609 Other obesity due to excess calories: Secondary | ICD-10-CM

## 2024-02-04 DIAGNOSIS — O24410 Gestational diabetes mellitus in pregnancy, diet controlled: Secondary | ICD-10-CM

## 2024-02-04 DIAGNOSIS — Z6838 Body mass index (BMI) 38.0-38.9, adult: Secondary | ICD-10-CM

## 2024-02-04 LAB — HEMOGLOBIN A1C
Estimated Avg Glucose: 105 mg/dL
Hemoglobin A1C: 5.3 % (ref 4.0–6.0)

## 2024-02-04 MED ORDER — TIRZEPATIDE-WEIGHT MANAGEMENT 2.5 MG/0.5ML SC SOAJ
2.5 | SUBCUTANEOUS | 0 refills | 28.00000 days | Status: DC
Start: 2024-02-04 — End: 2024-03-14

## 2024-02-04 NOTE — Progress Notes (Signed)
 [facility]  [facility]  [address]  [place] [address]     Date of Visit:  02/04/2024  Patient Name: Heather Huerta   Patient DOB:  Feb 28, 1981     CHIEF COMPLAINT:     Heather Huerta is a 43 y.o. female who presents today for an general visit to be evaluated for the following condition(s):  Chief Complaint   Patient presents with    Annual Exam     Patient here for yearly physical    Weight Management     would like to discuss weight loss       REVIEW OF SYSTEM      Review of Systems    HISTORY OF PRESENT ILLNESS     History of Present Illness  The patient presents for physical exam.     Currently breastfeeding her 11-month-old infant, plans to continue for a year. Attempted calorie-restricted diet with increased fluid intake at 6 weeks postpartum, resulting in decreased milk production. Enrolled in gym classes but has not attended for 4 weeks due to scheduling conflicts. Owns a malfunctioning treadmill. No hair loss, rashes, or back pain. Menstrual cycle resumed one month postpartum but has since stopped.. Considering Zepbound or [id number] for weight loss, concerned about side effects on infant and milk production. Willing to try if covered by insurance and will discontinue if adverse effects occur. Interested in Ozempic  samples.    Last recorded A1c level was 5.7, indicating prediabetes. History of gestational diabetes.    Canceled appointment with Dr. Celine Collard, ENT, due to pregnancy.      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    Past Surgical History:   Procedure  Laterality Date    INTRAUTERINE DEVICE INSERTION N/A 08/09/2018    MIrena  insertion NDC [id number]        Social History     Socioeconomic History    Marital status: Unknown   Tobacco Use    Smoking status: Never     Passive exposure: Never    Smokeless tobacco: Never   Vaping Use    Vaping status: Never Used   Substance and Sexual Activity    Alcohol  use: Not Currently    Drug use: Never    Sexual activity: Yes     Partners: Male     Social Drivers of Health     Financial Resource Strain: Low Risk  (10/16/2022)    Overall Financial Resource Strain (CARDIA)     Difficulty of Paying Living Expenses: Not hard at all   Food Insecurity: No Food Insecurity (10/05/2023)    Hunger Vital Sign     Worried About Running Out of Food in the Last Year: Never true     Ran Out of Food in the Last Year: Never true   Transportation Needs: No Transportation Needs (10/05/2023)    PRAPARE - Therapist, art (Medical): No  Lack of Transportation (Non-Medical): No   Housing Stability: Low Risk  (10/05/2023)    Housing Stability Vital Sign     Unable to Pay for Housing in the Last Year: No     Number of Times Moved in the Last Year: 1     Homeless in the Last Year: No        Results  Labs   - A1c: 5.7%    Current Outpatient Medications   Medication Sig Dispense Refill    tirzepatide-weight management (ZEPBOUND) 2.5 MG/0.5ML SOAJ subCUTAneous auto-injector pen Inject 2.5 mg into the skin every 7 days 2 mL 0    norethindrone  (ORTHO MICRONOR ) 0.35 MG tablet Take 1 tablet by mouth daily 28 tablet 11    Prenatal Vit-Fe Fumarate-FA (PRENATAL VITAMIN) 27-0.8 MG TABS Take 1 tablet by mouth daily 30 tablet 11     No current facility-administered medications for this visit.         PHYSICAL EXAM     BP (!) 102/58 (BP Site: Left Upper Arm, Patient Position: Sitting, BP Cuff Size: Medium Adult)   Pulse 91   Temp 97.1 F (36.2 C) (Temporal)   Resp 16   Ht 1.651 m (5' 5)   Wt 106 kg (233 lb 9.6  oz)   SpO2 99%   BMI 38.87 kg/m    Physical Exam   Physical Exam  - Neurological: Alert, oriented x4, no focal deficit  - Head: Normocephalic, atraumatic  - Eyes: PERRLA, conjunctivae clear  - Nose: Septum midline, nares patent, mucosa normal  - Mouth/Throat: Mucous membranes moist, no erythema, no exudate  - Neck: Supple, no abnormalities  - Respiratory: Clear to auscultation, no wheezing, rales, rhonchi  - Cardiovascular: Regular rate and rhythm, no murmurs, rubs, gallops  - Gastrointestinal: Soft, no tenderness, no distention, no masses  - Extremities: No edema, no cyanosis  - Musculoskeletal: No joint or muscular abnormalities  - Skin: No abnormalities, no rashes, lesions    ASSESSMENT/PLAN     Assessment & Plan  1. Weight management/ Physical exam- uncontrolled  - BMI 38, BP 102/58, s/p pregnancy, Vaginal delivery  - Prescribed Zepbound  - Advised to use savings card from [employer]  - Provided Ozempic  sample  - Informed no human data on infant harm or milk production effects  - Will stop medication if issues arise    2. Prediabetes/Gestational Diabetes:stable  - A1c 5.7, history of gestational diabetes  - A1c test today to monitor blood sugar levels    Follow-up in 1 month     Diagnosis Orders   1. Class 2 obesity due to excess calories with body mass index (BMI) of 38.0 to 38.9 in adult, unspecified whether serious comorbidity present  tirzepatide-weight management (ZEPBOUND) 2.5 MG/0.5ML SOAJ subCUTAneous auto-injector pen      2. Diet controlled gestational diabetes mellitus (GDM) in third trimester  Hemoglobin A1C      3. Physical exam              COMMUNICATION:     .    The patient (or guardian, if applicable) and other individuals in attendance with the patient were advised that Artificial Intelligence will be utilized during this visit to record, process the conversation to generate a clinical note, and support improvement of the AI technology. The patient (or guardian, if applicable) and other  individuals in attendance at the appointment consented to the use of AI, including the recording.  Electronically signed by Alise Appl, MD on 02/04/2024 at 4:55 PM

## 2024-03-13 ENCOUNTER — Encounter
Payer: PRIVATE HEALTH INSURANCE | Attending: Family | Primary: Student in an Organized Health Care Education/Training Program

## 2024-03-14 ENCOUNTER — Ambulatory Visit
Admit: 2024-03-14 | Discharge: 2024-03-14 | Payer: PRIVATE HEALTH INSURANCE | Attending: Family | Primary: Student in an Organized Health Care Education/Training Program

## 2024-03-14 VITALS — BP 108/76 | Ht 70.0 in | Wt 228.0 lb

## 2024-03-14 DIAGNOSIS — N644 Mastodynia: Principal | ICD-10-CM

## 2024-03-14 MED ORDER — FLUCONAZOLE 100 MG PO TABS
100 | ORAL_TABLET | Freq: Every day | ORAL | 0 refills | 6.00000 days | Status: DC
Start: 2024-03-14 — End: 2024-03-24

## 2024-03-14 NOTE — Progress Notes (Signed)
 Heather Huerta  03/14/2024    Date Of Birth:  24-Jul-1981          The patient was seen today. She is here regarding right breast pain x 9 days. Hx SVD 10/06/2023. Breast feeding her 61 month old daughter. Reports started to notice pain approx 9-10 days ago in right breast. Denies lump, redness or flu like symptoms. Unsure if she has clogged duct. Discussed concerns with lactation nurse and has tried different things to relieve the pain and possible clogged duct. Took 3 day course of Keflex  for possible mastitis with no relief. Denies having fever. Denies bloody discharge from nipple. Concerned because she has inflamed duct at tip of her right nipple. Pain in nipple shoots through her breast. Denies one specific area of pain in breast. Patient has continued to breastfeed on her regular schedule through the pain. Her bowels are regular and she is voiding without difficulty.     HPI:  Heather Huerta is a 43 y.o. female [id number]     Right breast pain for 9 days.      OB History   Gravida Para Term Preterm AB Living   5 4 4  0 1 4   SAB IAB Ectopic Molar Multiple Live Births   1 0 0 0 0 4      # Outcome Date GA Lbr Len/2nd Weight Sex Type Anes PTL Lv   5 Term 10/06/23 [redacted]w[redacted]d  3.61 kg (7 lb 15.3 oz) F Vag-Spont EPI Y LIV      Name: Barkley Derryl [place]      Apgar1: 8  Apgar5: 9   4 SAB 05/2021           3 Term 11/17/16    F Vag-Spont   LIV   2 Term 05/04/09    F Vag-Spont   LIV   1 Term 05/02/07    F Vag-Spont   LIV       History reviewed. No pertinent past medical history.    Past Surgical History:   Procedure Laterality Date    INTRAUTERINE DEVICE INSERTION N/A 08/09/2018    MIrena  insertion NDC [id number].[id number].Mar.2022       Family History   Problem Relation Age of Onset    Heart Attack Mother     High Cholesterol Brother        Social History     Socioeconomic History    Marital status: Unknown     Spouse name: Not on file    Number of children: Not on file    Years of education: Not on file    Highest  education level: Not on file   Occupational History    Not on file   Tobacco Use    Smoking status: Never     Passive exposure: Never    Smokeless tobacco: Never   Vaping Use    Vaping status: Never Used   Substance and Sexual Activity    Alcohol  use: Not Currently    Drug use: Never    Sexual activity: Yes     Partners: Male   Other Topics Concern    Not on file   Social History Narrative    Not on file     Social Drivers of Health     Financial Resource Strain: Low Risk  (10/16/2022)    Overall Financial Resource Strain (CARDIA)     Difficulty of Paying Living Expenses: Not hard at all   Food Insecurity: No Food Insecurity (10/05/2023)  Hunger Vital Sign     Worried About Running Out of Food in the Last Year: Never true     Ran Out of Food in the Last Year: Never true   Transportation Needs: No Transportation Needs (10/05/2023)    PRAPARE - Therapist, art (Medical): No     Lack of Transportation (Non-Medical): No   Physical Activity: Not on file   Stress: Not on file   Social Connections: Not on file   Intimate Partner Violence: Not on file   Housing Stability: Low Risk  (10/05/2023)    Housing Stability Vital Sign     Unable to Pay for Housing in the Last Year: No     Number of Times Moved in the Last Year: 1     Homeless in the Last Year: No         MEDICATIONS:  Current Outpatient Medications   Medication Sig Dispense Refill    fluconazole  (DIFLUCAN ) 100 MG tablet Take 1 tablet by mouth daily for 10 days 10 tablet 0     No current facility-administered medications for this visit.             ALLERGIES:  Allergies as of 03/14/2024 - Fully Reviewed 03/14/2024   Allergen Reaction Noted    Environmental/seasonal  08/27/2023         REVIEW OF SYSTEMS:    yes   A minimum of an eleven point review of systems was completed.    Review Of Systems (11 point):  Constitutional: No fever, chills or malaise; No weight change or fatigue  Head and Eyes: No vision, Headache, Dizziness or trauma in last  12 months  ENT ROS: No hearing, Tinnitis, sinus or taste problems  Hematological and Lymphatic ROS:No Lymphoma, Von Willebrand's, Hemophillia or Bleeding History  Psych ROS: No Depression, Homicidal thoughts,suicidal thoughts, or anxiety  Breast ROS: No prior breast abnormalities or lumps. + right breast pain  Respiratory ROS: No SOB, Pneumoniae,Cough, or Pulmonary Embolism History  Cardiovascular ROS: No Chest Pain with Exertion, Palpitations, Syncope, Edema, Arrhythmia  Gastrointestinal ROS: No Indigestion, Heartburn, Nausea, vomiting, Diarrhea, Constipation,or Bowel Changes; No Bloody Stools or melena  Genito-Urinary ROS: No Dysuria, Hematuria or Nocturia. No Urinary Incontinence or Vaginal Discharge  Musculoskeletal ROS: No Arthralgia, Arthritis,Gout,Osteoporosis or Rheumatism  Neurological ROS: No CVA, Migraines, Epilepsy, Seizure Hx, or Limb Weakness  Dermatological ROS: No Rash, Itching, Hives, Mole Changes or Cancer          Blood pressure 108/76, height 1.778 m (5' 10), weight 103.4 kg (228 lb), currently breastfeeding.             Abdomen: Soft non-tender; good bowel sounds. No guarding, rebound or rigidity. No CVA tenderness bilaterally.    Extremities: No calf tenderness, DTR 2/4, and No edema bilaterally    Pelvic: Exam deferred.    Breasts: normal appearance, no masses, no tenderness.  No erythema noted to breasts.  No nipple retraction or dimpling.  No nipple discharge or bleeding. Erythema noted to right nipple. Right nipple is slightly edematous, noted to be larger than the left nipple.   No axillary or supraclavicular adenopathy noted.     Diagnostics:  No results found.    Lab Results:  Results for orders placed or performed during the hospital encounter of 02/04/24   Hemoglobin A1C   Result Value Ref Range    Hemoglobin A1C 5.3 4.0 - 6.0 %    Estimated Avg Glucose 105 mg/dL  Assessment:   Diagnosis Orders   1. Breast pain, right  fluconazole  (DIFLUCAN ) 100 MG tablet    US  BREAST LIMITED  RIGHT      2. 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   Increased risk of Down Syndrome - the most common chromosomal birth defect (chromosomes - cells that carry genes and transmit heredity information)   Increased risk of miscarriage   20% increase at ages 41 to 18   35% increase at ages 15-44   Over 50% increase by age 16  Increased risk of Cesarean Section (C-Section) for delivery method   Gestational diabetes - diabetes that develops for the first time during pregnancy. Women who have this could possibly have a very large baby who then is at risk for injuries during delivery.   Pregnancy Induced Hypertension - High blood pressure   Placental Problems - one of the most common placental problems is placenta previa in which the placenta covers all or part of the uterine opening (cervix). This can cause severe bleeding during delivery and can make C-section delivery necessary.  Even with all of these increased risks, with the advancement in medical procedures and testing, there are several ways to reduce your risk. They include early and regular prenatal care, taking the multivitamin with folic acid prescribed by your health care manager, beginning pregnancy at a healthy weight, not smoking or drinking alcoholic beverages, and eating healthy foods.   There are tests that all pregnant women are  encouraged to take, but there is additional prenatal testing that is regularly offered to any woman 33 or older because of the potential of increased risks to the mother and baby. These tests are chorionic villus sampling (CVS) and amniocentesis. NIPT is also available which is a non-invasive option for fetal karyotyping.  With CVS, a small sample of cells (called chorionic villi) is taken from the placenta where it attached to the wall of the uterus. Chorionic villi are tiny parts of the placenta; therefore they have the same genes as the baby. This test is 98% accurate, but can carry a slightly higher risk of miscarriage than amniocentesis, since the procedure is done in early pregnancy.   Amniocentesis is a procedure where a sample of fluid is removed from the amniotic sac for analysis and evaluation. During this procedure, fluid is removed by placing a long needle through the abdominal wall into the amniotic sac. The amniocentesis needle is typically guided into the sac with the help of ultrasound imaging. Once the needle is in the sac, a syringe is used to withdraw the clear amber-colored amniotic fluid, which looks a bit like urine.  NIPT, utilizes the maternal  blood to test for fetal cells. These fetal cells are then karyotyped for genetic evaluation.  All of these tests, CVS, NIPT and amniocentesis, let couples know if the fetus will have genetic abnormalities, and will help them make informed decisions regarding their pregnancy. Karyotyping by either CVS, NIPT or Amniocentesis is the ONLY way to confirm the genetic chromosomal structure regarding trisomy; Down's Syndrome, Edward's or Pateau's. TOP ST OH was reviewed. If NIPT is positive then a confirmatory amniocentesis would be recommended to confirm the diagnosis.  TOPSTOH guidelines were reviewed.        Vaginal delivery 10/07/2023    SVD 10/06/23 F Apg 8/9 Wt 7#15 10/06/2023    Asymptomatic bacteriuria during pregnancy 02/24/2023    Nasal polyp  10/17/2022           PLAN:  Return if symptoms worsen or fail to improve.  Script for diflucan  sent.  Right breast ultrasound if pain does not resolve  Continue breastfeeding and pumping on normal schedule.  Keep nipples dry and change nursing pads frequently.  Repeat Annual every 1 year  Cervical Cytology Evaluation begins at 44 years old.  If Negative Cytology, Follow-up screening per current guidelines.   Return to the office in PRN weeks.  Counseled on preventative health maintenance follow-up.  Orders Placed This Encounter   Procedures    US  BREAST LIMITED RIGHT     Standing Status:   Future     Expected Date:   03/14/2024     Expiration Date:   03/14/2025     Reason for exam::   right breast pain           The patient, Nature Kueker is a 43 y.o. female, was seen with a total time spent of 20 minutes for the visit on this date of service by the E/M provider. The time component had both face to face and non face to face time spent in determining the total time component.  Counseling and education regarding her diagnosis listed below and her options regarding those diagnoses were also included in determining her time component.      Diagnosis Orders   1. Breast pain, right  fluconazole  (DIFLUCAN ) 100 MG tablet    US  BREAST LIMITED RIGHT      2. Candidiasis of breast  fluconazole  (DIFLUCAN ) 100 MG tablet           The patient had her preventative health maintenance recommendations and follow-up reviewed with her at the completion of her visit.

## 2024-03-23 NOTE — Telephone Encounter (Signed)
 Pt seen last week for mastitis, finished meds however still not feeling that much better.     Please call pt at [phone] to discuss

## 2024-03-23 NOTE — Telephone Encounter (Signed)
 LVM for pt and messaged via MyChart. Order for [facility] ointment faxed to [facility]. Pt has breast sono scheduled 03/24/24 for breast concerns.

## 2024-03-23 NOTE — Telephone Encounter (Signed)
 Pt seen last week for mastitis, finished meds however still not feeling that much better.      Please call pt at [phone] to discuss          Reviewed with Nat. Order for [facility] ointment faxed to [facility] pharmacy. Pt informed to try ointment and to keep ultrasound for tomorrow.

## 2024-03-24 ENCOUNTER — Inpatient Hospital Stay: Payer: PRIVATE HEALTH INSURANCE | Primary: Student in an Organized Health Care Education/Training Program

## 2024-03-24 ENCOUNTER — Inpatient Hospital Stay
Admit: 2024-03-24 | Payer: PRIVATE HEALTH INSURANCE | Primary: Student in an Organized Health Care Education/Training Program

## 2024-03-24 ENCOUNTER — Ambulatory Visit
Admit: 2024-03-24 | Discharge: 2024-03-24 | Payer: PRIVATE HEALTH INSURANCE | Attending: Family | Primary: Student in an Organized Health Care Education/Training Program

## 2024-03-24 DIAGNOSIS — N644 Mastodynia: Principal | ICD-10-CM

## 2024-03-24 MED ORDER — CLINDAMYCIN HCL 300 MG PO CAPS
300 | ORAL_CAPSULE | Freq: Three times a day (TID) | ORAL | 0 refills | 6.00000 days | Status: AC
Start: 2024-03-24 — End: 2024-03-31

## 2024-03-24 MED ORDER — FLUCONAZOLE 150 MG PO TABS
150 | ORAL_TABLET | Freq: Every day | ORAL | 0 refills | 6.00000 days | Status: AC
Start: 2024-03-24 — End: ?

## 2024-03-24 NOTE — Progress Notes (Signed)
 Heather Huerta  03/24/2024    Date Of Birth:  [birth year]          The patient was seen today. She is here regarding right breast pain. Hx SVD 10/06/2023. Breastfeeding her 42 month old daughter. Tried 10 day course of keflex  for possible mastitis with no relief. Patient seen in office on 03/14/2024 and  prescribed 10 day course of diflucan . Finished it 1-2 days ago. First 2 days her symptoms improved but then returned. Reports her infant was treated for thrush at the same time. Patient denies redness or lumps to right breast. Denies bloody discharge in milk. Denies fever. Pain starts in right nipple and shoots upward through middle of breast. Always same area. No lumps. Denies fam hx of breast cancer.  Started topical ointment through [facility] pharmacy twice topically yesterday but made pain worse.  Right breast ultrasound scheduled later today.  Her bowels are regular and she is voiding without difficulty.     HPI:  Heather Huerta is a 43 y.o. female [phone]     Right breast pain      OB History   Gravida Para Term Preterm AB Living   5 4 4  0 1 4   SAB IAB Ectopic Molar Multiple Live Births   1 0 0 0 0 4      # Outcome Date GA Lbr Len/2nd Weight Sex Type Anes PTL Lv   5 Term 10/06/23 [redacted]w[redacted]d  3.61 kg (7 lb 15.3 oz) F Vag-Spont EPI Y LIV      Name: Barkley [facility]      Apgar1: 8  Apgar5: 9   4 SAB 05/2021           3 Term 11/17/16    F Vag-Spont   LIV   2 Term 05/04/09    F Vag-Spont   LIV   1 Term 05/02/07    F Vag-Spont   LIV       History reviewed. No pertinent past medical history.    Past Surgical History:   Procedure Laterality Date    INTRAUTERINE DEVICE INSERTION N/A 08/09/2018    MIrena  insertion NDC [id number].[id number].Mar.2022       Family History   Problem Relation Age of Onset    Heart Attack Mother     High Cholesterol Brother        Social History     Socioeconomic History    Marital status: Unknown     Spouse name: Not on file    Number of children: Not on file    Years of education: Not on  file    Highest education level: Not on file   Occupational History    Not on file   Tobacco Use    Smoking status: Never     Passive exposure: Never    Smokeless tobacco: Never   Vaping Use    Vaping status: Never Used   Substance and Sexual Activity    Alcohol  use: Not Currently    Drug use: Never    Sexual activity: Yes     Partners: Male   Other Topics Concern    Not on file   Social History Narrative    Not on file     Social Drivers of Health     Financial Resource Strain: Low Risk  (10/16/2022)    Overall Financial Resource Strain (CARDIA)     Difficulty of Paying Living Expenses: Not hard at all   Food Insecurity: No Food Insecurity (10/05/2023)  Hunger Vital Sign     Worried About Running Out of Food in the Last Year: Never true     Ran Out of Food in the Last Year: Never true   Transportation Needs: No Transportation Needs (10/05/2023)    PRAPARE - Therapist, art (Medical): No     Lack of Transportation (Non-Medical): No   Physical Activity: Not on file   Stress: Not on file   Social Connections: Not on file   Intimate Partner Violence: Not on file   Housing Stability: Low Risk  (10/05/2023)    Housing Stability Vital Sign     Unable to Pay for Housing in the Last Year: No     Number of Times Moved in the Last Year: 1     Homeless in the Last Year: No         MEDICATIONS:  Current Outpatient Medications   Medication Sig Dispense Refill    clindamycin  (CLEOCIN ) 300 MG capsule Take 1 capsule by mouth 3 times daily for 7 days 21 capsule 0    fluconazole  (DIFLUCAN ) 150 MG tablet Take 1 tablet by mouth daily 10 tablet 0     No current facility-administered medications for this visit.             ALLERGIES:  Allergies as of 03/24/2024 - Fully Reviewed 03/24/2024   Allergen Reaction Noted    Environmental/seasonal  08/27/2023         REVIEW OF SYSTEMS:    yes   A minimum of an eleven point review of systems was completed.    Review Of Systems (11 point):  Constitutional: No fever, chills  or malaise; No weight change or fatigue  Head and Eyes: No vision, Headache, Dizziness or trauma in last 12 months  ENT ROS: No hearing, Tinnitis, sinus or taste problems  Hematological and Lymphatic ROS:No Lymphoma, Von Willebrand's, Hemophillia or Bleeding History  Psych ROS: No Depression, Homicidal thoughts,suicidal thoughts, or anxiety  Breast ROS: No prior breast abnormalities or lumps. + right breast pain  Respiratory ROS: No SOB, Pneumoniae,Cough, or Pulmonary Embolism History  Cardiovascular ROS: No Chest Pain with Exertion, Palpitations, Syncope, Edema, Arrhythmia  Gastrointestinal ROS: No Indigestion, Heartburn, Nausea, vomiting, Diarrhea, Constipation,or Bowel Changes; No Bloody Stools or melena  Genito-Urinary ROS: No Dysuria, Hematuria or Nocturia. No Urinary Incontinence or Vaginal Discharge  Musculoskeletal ROS: No Arthralgia, Arthritis,Gout,Osteoporosis or Rheumatism  Neurological ROS: No CVA, Migraines, Epilepsy, Seizure Hx, or Limb Weakness  Dermatological ROS: No Rash, Itching, Hives, Mole Changes or Cancer          Blood pressure 116/72, height 1.727 m (5' 8), weight 103.9 kg (229 lb), currently breastfeeding.           Abdomen: Soft non-tender; good bowel sounds. No guarding, rebound or rigidity. No CVA tenderness bilaterally.    Extremities: No calf tenderness, DTR 2/4, and No edema bilaterally    Pelvic: Exam deferred.    Breasts: normal appearance, no masses, no tenderness to right breast.  No nipple retraction or dimpling to right breast.  No nipple discharge or bleeding to right breast.  No axillary or supraclavicular adenopathy noted right side.    Diagnostics:  No results found.    Lab Results:  Results for orders placed or performed during the hospital encounter of 02/04/24   Hemoglobin A1C   Result Value Ref Range    Hemoglobin A1C 5.3 4.0 - 6.0 %    Estimated Avg Glucose 105  mg/dL         Assessment:   Diagnosis Orders   1. Breast pain, right  [place] - Benjamine, Le, MD, Breast  Surgery, [place]     clindamycin  (CLEOCIN ) 300 MG capsule    Culture, Aerobic    fluconazole  (DIFLUCAN ) 150 MG tablet      2. Candidiasis of breast  [place] - Benjamine, Le, MD, Breast Surgery, [place]       3. 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   Increased risk of Down Syndrome - the most common chromosomal birth defect (chromosomes - cells that carry genes and transmit heredity information)   Increased risk of miscarriage   20% increase at ages 30 to 45   35% increase at ages 64-44   Over 50% increase by age 51  Increased risk of Cesarean Section (C-Section) for delivery method   Gestational diabetes - diabetes that develops for the first time during pregnancy. Women who have this could possibly have a very large baby who then is at risk for injuries during delivery.   Pregnancy Induced Hypertension - High blood pressure   Placental Problems - one of the most common placental problems is placenta previa in which the placenta covers all or part of the uterine opening (cervix). This can cause severe bleeding during delivery and can make C-section delivery necessary.  Even with all of these increased risks, with the advancement in medical procedures and testing, there are several ways to reduce your risk. They include early and regular prenatal care, taking the multivitamin with folic acid prescribed by your health care manager,  beginning pregnancy at a healthy weight, not smoking or drinking alcoholic beverages, and eating healthy foods.   There are tests that all pregnant women are encouraged to take, but there is additional prenatal testing that is regularly offered to any woman 36 or older because of the potential of increased risks to the mother and baby. These tests are chorionic villus sampling (CVS) and amniocentesis. NIPT is also available which is a non-invasive option for fetal karyotyping.  With CVS, a small sample of cells (called chorionic villi) is taken from the placenta where it attached to the wall of the uterus. Chorionic villi are tiny parts of the placenta; therefore they have the same genes as the baby. This test is 98% accurate, but can carry a slightly higher risk of miscarriage than amniocentesis, since the procedure is done in early pregnancy.   Amniocentesis is a procedure where a sample of fluid is removed from the amniotic sac for analysis and evaluation. During this procedure, fluid is removed by placing a long needle through the abdominal wall into the amniotic sac. The amniocentesis needle  is typically guided into the sac with the help of ultrasound imaging. Once the needle is in the sac, a syringe is used to withdraw the clear amber-colored amniotic fluid, which looks a bit like urine.  NIPT, utilizes the maternal blood to test for fetal cells. These fetal cells are then karyotyped for genetic evaluation.  All of these tests, CVS, NIPT and amniocentesis, let couples know if the fetus will have genetic abnormalities, and will help them make informed decisions regarding their pregnancy. Karyotyping by either CVS, NIPT or Amniocentesis is the ONLY way to confirm the genetic chromosomal structure regarding trisomy; Down's Syndrome, Edward's or Pateau's. TOP ST OH was reviewed. If NIPT is positive then a confirmatory amniocentesis would be recommended to confirm the diagnosis.  TOPSTOH guidelines were  reviewed.        Vaginal delivery 10/07/2023    SVD 10/06/23 F Apg 8/9 Wt 7#15 10/06/2023    Asymptomatic bacteriuria during pregnancy 02/24/2023    Nasal polyp 10/17/2022           PLAN:  Return if symptoms worsen or fail to improve.  Aerobic culture of right breast milk obtained.  Script for cleocin  and diflucan  sent.  Referral to DR Benjamine, breast surgeon.  Complete right breast ultrasound today.  Repeat Annual every 1 year  Cervical Cytology Evaluation begins at 44 years old.  If Negative Cytology, Follow-up screening per current guidelines.   Return to the office in PRN weeks.  Counseled on preventative health maintenance follow-up.  Orders Placed This Encounter   Procedures    Culture, Aerobic     Breast milk/right breast     Standing Status:   Future     Expected Date:   03/24/2024     Expiration Date:   03/24/2025    Loras GLENWOOD Benjamine Le, MD, Breast Surgery, [place]      Referral Priority:   Routine     Referral Type:   Eval and Treat     Referral Reason:   Specialty Services Required     Referred to Provider:   Benjamine Le, MD     Requested Specialty:   General Surgery     Number of Visits Requested:   1           The patient, Jeannelle Wiens is a 43 y.o. female, was seen with a total time spent of 20 minutes for the visit on this date of service by the E/M provider. The time component had both face to face and non face to face time spent in determining the total time component.  Counseling and education regarding her diagnosis listed below and her options regarding those diagnoses were also included in determining her time component.      Diagnosis Orders   1. Breast pain, right  [place] - Benjamine, Le, MD, Breast Surgery, [place]     clindamycin  (CLEOCIN ) 300 MG capsule    Culture, Aerobic    fluconazole  (DIFLUCAN ) 150 MG tablet      2. Candidiasis of breast  [place] - Benjamine, Le, MD, Breast Surgery, [place]       3. Postpartum care and examination             The patient had her preventative health maintenance  recommendations and follow-up reviewed with her at the completion of her visit.

## 2024-03-26 LAB — CULTURE, AEROBIC
Culture: NORMAL
Direct Exam: NONE SEEN
Direct Exam: NONE SEEN

## 2024-03-27 NOTE — Other (Signed)
Pt viewed results and recommendations via Mychart.

## 2024-03-28 ENCOUNTER — Ambulatory Visit
Admit: 2024-03-28 | Discharge: 2024-03-28 | Payer: PRIVATE HEALTH INSURANCE | Primary: Student in an Organized Health Care Education/Training Program

## 2024-03-28 DIAGNOSIS — N644 Mastodynia: Principal | ICD-10-CM

## 2024-03-28 MED ORDER — IBUPROFEN 600 MG PO TABS
600 | ORAL_TABLET | Freq: Three times a day (TID) | ORAL | 0 refills | 15.00000 days | Status: AC | PRN
Start: 2024-03-28 — End: ?

## 2024-03-28 NOTE — Progress Notes (Unsigned)
 Patient's Name/Date of Birth: Heather Huerta / [birth year] (43 y.o.)    Date: March 28, 2024     Referring provider:  Dorcus Mliss LABOR, APRN - C*     HPI: Pt is a 43 y.o. female who presents as a consult for evaluation for:   Chief Complaint   Patient presents with    New Patient     Acute Breast pain, right, Candidiasis of breast      3 weeks of breast pain. Suden shart shooting after feeding especially starting at nipple and stooting up to one oclock and the actual pain is from 9 -1 00 in th upper breast.    Normal appearing reast up until yesterday where  a small pustule was started.    On abx diflucan  and keflex  and had improvement for a couple of days.  No fevers or chills     Has tried heat and ibuprofen  with some relief.    GYN HISTORY:  Menarche: 12  Pregnancy History: G 5, P 4, First Child at 64 years old  No resumption of periods since baby    Family History:  none    Physical Exam:  Vitals:    03/28/24 0918   BP: 109/72   Pulse: (!) 104   SpO2: 97%     Physical Exam     Assessment/Plan:  No diagnosis found.      Heather Huerta is a 43 y.o. female that is seen today for ***.  My impression is ***.    No follow-ups on file.     Le Leech, MD  03/28/2024 9:23 AM           No past medical history on file.    Past Surgical History:   Procedure Laterality Date    INTRAUTERINE DEVICE INSERTION N/A 08/09/2018    MIrena  insertion NDC [id number]       Current Outpatient Medications   Medication Sig Dispense Refill    clindamycin  (CLEOCIN ) 300 MG capsule Take 1 capsule by mouth 3 times daily for 7 days 21 capsule 0    fluconazole  (DIFLUCAN ) 150 MG tablet Take 1 tablet by mouth daily 10 tablet 0     No current facility-administered medications for this visit.       Allergies   Allergen Reactions    Environmental/Seasonal        Family History   Problem Relation Age of Onset    Heart Attack Mother     High Cholesterol Brother        Social History     Socioeconomic History    Marital  status: Married     Spouse name: Not on file    Number of children: Not on file    Years of education: Not on file    Highest education level: Not on file   Occupational History    Not on file   Tobacco Use    Smoking status: Never     Passive exposure: Never    Smokeless tobacco: Never   Vaping Use    Vaping status: Never Used   Substance and Sexual Activity    Alcohol  use: Not Currently    Drug use: Never    Sexual activity: Yes     Partners: Male   Other Topics Concern    Not on file   Social History Narrative    Not on file     Social Drivers of Health     Financial Resource Strain:  Low Risk  (10/16/2022)    Overall Financial Resource Strain (CARDIA)     Difficulty of Paying Living Expenses: Not hard at all   Food Insecurity: No Food Insecurity (10/05/2023)    Hunger Vital Sign     Worried About Running Out of Food in the Last Year: Never true     Ran Out of Food in the Last Year: Never true   Transportation Needs: No Transportation Needs (10/05/2023)    PRAPARE - Therapist, art (Medical): No     Lack of Transportation (Non-Medical): No   Physical Activity: Not on file   Stress: Not on file   Social Connections: Not on file   Intimate Partner Violence: Not on file   Housing Stability: Low Risk  (10/05/2023)    Housing Stability Vital Sign     Unable to Pay for Housing in the Last Year: No     Number of Times Moved in the Last Year: 1     Homeless in the Last Year: No       Review of Systems:   Please refer to the accompanying nursing/medical assistant review of systems documentation.       IMAGING:  Mammogram Result (most recent):  No results found for this or any previous visit from the past 3650 days.     Ultrasound Result (most recent):  US  BREAST LIMITED RIGHT 03/24/2024    Narrative  EXAMINATION:  TARGETED ULTRASOUND OF THE RIGHT BREAST    03/24/2024    COMPARISON:  None.    HISTORY:  ORDERING SYSTEM PROVIDED HISTORY: Breast pain, right  TECHNOLOGIST PROVIDED HISTORY:  right breast  pain    The patient is currently breastfeeding.    FINDINGS:  In the right breast, in the retroareolar region, there are multiple prominent  ducts without evidence of an intraductal mass.  No suspicious masses or  cystic lesions.    There is a prominent right axillary lymph node, likely reactive.    Impression  1. Unremarkable right breast ultrasound.  The patient is due for screening  mammography now.    BIRADS:  BIRADS - CATEGORY 2    Benign, no evidence of malignancy.  Normal interval follow-up is recommended  in 12 months.    OVERALL ASSESSMENT - BENIGN    A letter of notification will be sent to the patient regarding the results.

## 2024-03-28 NOTE — Patient Instructions (Signed)
 Breast pain/cyst management:    Eliminate all caffeine!  Apply heat at least 3-4 times a day for 20-30 minutes.  Wear a supportive bra without wires as much as possible.  Take a daily Vitamin E supplement.  Take NSAIDs for pain (ibuprofen  or naproxen) if you can.  Ibuprofen  (600 or 800 mg) every 8 -12 hours for 3 days then as needed for pain.  Naproxen every 12 hours for 3 days then as needed for pain.     For your mammogram take ibuprofen  before and ice after

## 2024-04-04 NOTE — Telephone Encounter (Signed)
 Unfortunately, I don't have solution except to give you stronger pain meds. It sounds like it is improving some since the pain with breastfeeding is improved. The intermittent pain and overall pain will take some time to improve. Try adding tylenol  too and keep doing the heat and ibuprofen . If you want the stronger medication sent in we can do that as well.

## 2024-04-07 ENCOUNTER — Inpatient Hospital Stay
Admit: 2024-04-07 | Payer: PRIVATE HEALTH INSURANCE | Primary: Student in an Organized Health Care Education/Training Program

## 2024-04-07 DIAGNOSIS — N644 Mastodynia: Principal | ICD-10-CM

## 2024-04-07 DIAGNOSIS — R9389 Abnormal findings on diagnostic imaging of other specified body structures: Principal | ICD-10-CM

## 2024-04-07 MED ORDER — CEPHALEXIN 500 MG PO CAPS
500 | ORAL_CAPSULE | Freq: Four times a day (QID) | ORAL | 0 refills | 7.00000 days | Status: AC
Start: 2024-04-07 — End: ?

## 2024-04-07 MED ORDER — OXYCODONE HCL 5 MG PO TABS
5 | ORAL_TABLET | Freq: Four times a day (QID) | ORAL | 0 refills | 5.00000 days | Status: AC | PRN
Start: 2024-04-07 — End: 2024-04-10

## 2024-04-07 NOTE — Progress Notes (Signed)
 Pt is having drainage from her nipple and is concerned about infection. Keflex  will be sent in for her, warm compresses recommended, and a clinic visit on Monday for re-evaluation.

## 2024-04-07 NOTE — Progress Notes (Signed)
 Pt is also asking for stronger pain meds. Oxycodone  will be sent in for her.

## 2024-04-07 NOTE — Telephone Encounter (Deleted)
 Pt called back, she said she is not seeing the Mychart messages.   She is in a lot of pain and is now seeing a pus like pimple, she is worried there is infection.   She is planning on getting the mammogram today, and she does want the appt at 8:30 Monday morning.   She would like a phone call today if possible.

## 2024-04-07 NOTE — Telephone Encounter (Signed)
 Pt called back, she said she is not seeing the Mychart messages.   She is in a lot of pain and is now seeing a pus like pimple, she is worried there is infection.   She is planning on getting the mammogram today, and she does want the appt at 8:30 Monday morning.   She would like a phone call today if possible.

## 2024-04-10 ENCOUNTER — Ambulatory Visit
Admit: 2024-04-10 | Discharge: 2024-04-10 | Payer: PRIVATE HEALTH INSURANCE | Primary: Student in an Organized Health Care Education/Training Program

## 2024-04-10 VITALS — BP 123/82 | HR 89 | Ht 68.0 in | Wt 229.2 lb

## 2024-04-10 DIAGNOSIS — N644 Mastodynia: Principal | ICD-10-CM

## 2024-04-10 MED ORDER — IBUPROFEN 600 MG PO TABS
600 | ORAL_TABLET | Freq: Three times a day (TID) | ORAL | 0 refills | 15.00000 days | Status: AC | PRN
Start: 2024-04-10 — End: ?

## 2024-04-10 NOTE — Patient Instructions (Signed)
 Plan to pump on right and breastfeed on left  Wean the ibuprofen  first.  Take oxycodone  and pump/dump if pain does not improve.  Feel free to text [phone].  Okay to try to caffeine and see how you feel.

## 2024-04-10 NOTE — Progress Notes (Signed)
 Patient's Name/Date of Birth: Heather Huerta / 12-11-1980 (43 y.o.)    Date: April 10, 2024     HPI: Pt is a 44 y.o. female who presents to [facility] for a follow-up visit for right nipple pain and pustule.  The patient has tried conservative measures and states that she still having pain.  Prior to the weekend, medication was ordered for her including antibiotics as she was concerned about pending infection. Pustule spontaneously ruptured. Feels worse now with feeding but not as bad with pump.  The pain is also become more cyclical and she has not had the severe sharp pain for about 2 days.  The pain is worse in the evening.    In the interim since her last visit on 03/28/2024, she has had the following imaging:DIAGNOSTIC DIGITAL BILATERAL BREASTS MAMMOGRAM WITH TOMOSYNTHESIS; TARGETED  ULTRASOUND OF THE RIGHT BREAST 04/07/2024  FINDINGS:     Mammogram:     Density: Breasts are heterogeneously dense which can obscure small masses.     Bilateral breasts are composed of heterogeneously dense parenchyma.  No skin  thickening, nipple contour changes, are noted.  The patient has a prominent  right axillary lymph node which was previously evaluated on 24 March 2024  showing cortical thickening but normal architecture.  The patient has a  nodular mass in the upper outer right breast middle depth requiring  additional ultrasound workup.  No suspicious calcifications are present.  No  asymmetric density.        Ultrasound:     Targeted ultrasonography right breast at 9 o'clock in the area of the  patient's nodule reveals a lymph node of normal architecture and appearance  without cortical thickening or disorder.  Correlates with the nodule on  mammography.     IMPRESSION:  No specific imaging abnormality correlating with the patient's complaints of  breast pain.  The patient has an intramammary lymph node at 9 o'clock 10 cm  from the nipple right breast.  Please note the patient previously  had  ultrasound of the right axilla demonstrating a lymph node with thickened  cortex.  Six-month ultrasound follow-up right axilla advised.  Clinical  monitoring for the patient's breast pain is advised.      She has the following issues that she would like addressed.     Physical Exam:  Vitals:    04/10/24 0846   BP: 123/82   Pulse: 89     General:A & O x3  Breast: Right nipple pustule has resolved          Assessment/Plan:  1. Breast pain, right    2. Abnormal findings on diagnostic imaging of breast          Heather Huerta is a 43 y.o. female that is seen today for a follow-up visit for right nipple pain and pustule.  Patient likely has subclinical mastitis.  The plan will be for her to pump on the right with the baby breast-feed on the left.  If she has to try the oxycodone  which was prescribed for her over the weekend, she will pump and dump for a couple of days and see if that helps the pain to resolve.  In the meantime, the patient has a repeat ultrasound of her right breast that I recommended and her primary care has already ordered to occur in February.  She will return to clinic if necessary.    Return if symptoms worsen or fail to improve.  Le Leech, MD  04/10/2024 9:07 AM           No past medical history on file.    Past Surgical History:   Procedure Laterality Date    INTRAUTERINE DEVICE INSERTION N/A 08/09/2018    MIrena  insertion NDC [id number]       Current Outpatient Medications   Medication Sig Dispense Refill    ibuprofen  (ADVIL ;MOTRIN ) 600 MG tablet Take 1 tablet by mouth 3 times daily as needed for Pain 30 tablet 0    cephALEXin  (KEFLEX ) 500 MG capsule Take 1 capsule by mouth 4 times daily 10 capsule 0    oxyCODONE  (ROXICODONE ) 5 MG immediate release tablet Take 1 tablet by mouth every 6 hours as needed for Pain for up to 3 days. Intended supply: 3 days. Take lowest dose possible to manage pain Max Daily Amount: 20 mg 12 tablet 0    fluconazole  (DIFLUCAN ) 150 MG  tablet Take 1 tablet by mouth daily (Patient not taking: Reported on 04/10/2024) 10 tablet 0     No current facility-administered medications for this visit.       Allergies   Allergen Reactions    Environmental/Seasonal        Family History   Problem Relation Age of Onset    Heart Attack Mother     High Cholesterol Brother        Social History     Socioeconomic History    Marital status: Married     Spouse name: Not on file    Number of children: Not on file    Years of education: Not on file    Highest education level: Not on file   Occupational History    Not on file   Tobacco Use    Smoking status: Never     Passive exposure: Never    Smokeless tobacco: Never   Vaping Use    Vaping status: Never Used   Substance and Sexual Activity    Alcohol  use: Not Currently    Drug use: Never    Sexual activity: Yes     Partners: Male   Other Topics Concern    Not on file   Social History Narrative    Not on file     Social Drivers of Health     Financial Resource Strain: Low Risk  (10/16/2022)    Overall Financial Resource Strain (CARDIA)     Difficulty of Paying Living Expenses: Not hard at all   Food Insecurity: Not on file (10/05/2023)   Transportation Needs: No Transportation Needs (10/05/2023)    PRAPARE - Transportation     Lack of Transportation (Medical): No     Lack of Transportation (Non-Medical): No   Physical Activity: Not on file   Stress: Not on file   Social Connections: Not on file   Intimate Partner Violence: Not on file   Housing Stability: Low Risk  (10/05/2023)    Housing Stability Vital Sign     Unable to Pay for Housing in the Last Year: No     Number of Times Moved in the Last Year: 1     Homeless in the Last Year: No       Review of Systems:   Please refer to the accompanying nursing/medical assistant review of systems documentation.    IMAGING:  Mammogram Result (most recent):  MAM TOMO DIGITAL DIAGNOSTIC BILATERAL 04/07/2024    Narrative  EXAMINATION:  DIAGNOSTIC DIGITAL BILATERAL BREASTS MAMMOGRAM  WITH TOMOSYNTHESIS; TARGETED  ULTRASOUND OF THE RIGHT BREAST    04/07/2024 3:23 pm; 04/07/2024 3:54 pm    TECHNIQUE:  Diagnostic mammography of the bilateral breasts was performed with  tomosynthesis.  2D standard and 3D tomosynthesis combination imaging  performed through both breasts.  Computer aided detection was utilized in the  interpretation of this exam.; Targeted ultrasound of the right breast was  performed.    VIEWS: MLO and craniocaudal views of both breasts.    COMPARISON:  No prior mammograms.  Right breast ultrasound 24 March 2024.    HISTORY:  ORDERING SYSTEM PROVIDED HISTORY: Breast pain, right  TECHNOLOGIST PROVIDED HISTORY:  bilateral breast pain; baseline; breast feeding  Is the patient pregnant?->No; ORDERING SYSTEM PROVIDED HISTORY: Abnormal  finding on imaging    Patient is currently nursing, delivered a baby several months ago.  Patient  complains of right breast pain.    FINDINGS:    Mammogram:    Density: Breasts are heterogeneously dense which can obscure small masses.    Bilateral breasts are composed of heterogeneously dense parenchyma.  No skin  thickening, nipple contour changes, are noted.  The patient has a prominent  right axillary lymph node which was previously evaluated on 24 March 2024  showing cortical thickening but normal architecture.  The patient has a  nodular mass in the upper outer right breast middle depth requiring  additional ultrasound workup.  No suspicious calcifications are present.  No  asymmetric density.      Ultrasound:    Targeted ultrasonography right breast at 9 o'clock in the area of the  patient's nodule reveals a lymph node of normal architecture and appearance  without cortical thickening or disorder.  Correlates with the nodule on  mammography.    Impression  No specific imaging abnormality correlating with the patient's complaints of  breast pain.  The patient has an intramammary lymph node at 9 o'clock 10 cm  from the nipple right breast.  Please note  the patient previously had  ultrasound of the right axilla demonstrating a lymph node with thickened  cortex.  Six-month ultrasound follow-up right axilla advised.  Clinical  monitoring for the patient's breast pain is advised.    BREAST DENSITY SUMMARY C: The breasts are heterogeneously dense which may  obscure small masses.    Breast tissue can be either dense or not dense. Dense tissue makes it harder  to find breast cancer on a mammogram and also raises the risk of developing  breast cancer. Your breast tissue is  DENSE. In some people with dense  tissue, other imaging tests in addition to mammogram may help find cancers.  Talk to your health care provider about breast density, risks  for breast  cancer, and your individual situation.    BI-RADS 3    BIRADS:  BIRADS - CATEGORY 3    Findings are probably benign. A short interval follow-up right axillary  ultrasound is recommended in 6 months.    OVERALL ASSESSMENT -PROBABLY BENIGN.    A letter of notification will be sent to the patient regarding the results.      Performing Facility: [facility] - [address]. [facility] [address] [place] , [place]  [address] Phone: [phone]     Ultrasound Result (most recent):  US  BREAST LIMITED RIGHT 04/07/2024    Narrative  EXAMINATION:  DIAGNOSTIC DIGITAL BILATERAL BREASTS MAMMOGRAM WITH TOMOSYNTHESIS; TARGETED  ULTRASOUND OF THE RIGHT BREAST    04/07/2024 3:23 pm; 04/07/2024 3:54 pm    TECHNIQUE:  Diagnostic mammography of the bilateral breasts was performed with  tomosynthesis.  2D standard and 3D tomosynthesis combination imaging  performed through both breasts.  Computer aided detection was utilized in the  interpretation of this exam.; Targeted ultrasound of the right breast was  performed.    VIEWS: MLO and craniocaudal views of both breasts.    COMPARISON:  No prior mammograms.  Right breast ultrasound 24 March 2024.    HISTORY:  ORDERING SYSTEM PROVIDED HISTORY: Breast pain, right  TECHNOLOGIST PROVIDED  HISTORY:  bilateral breast pain; baseline; breast feeding  Is the patient pregnant?->No; ORDERING SYSTEM PROVIDED HISTORY: Abnormal  finding on imaging    Patient is currently nursing, delivered a baby several months ago.  Patient  complains of right breast pain.    FINDINGS:    Mammogram:    Density: Breasts are heterogeneously dense which can obscure small masses.    Bilateral breasts are composed of heterogeneously dense parenchyma.  No skin  thickening, nipple contour changes, are noted.  The patient has a prominent  right axillary lymph node which was previously evaluated on 24 March 2024  showing cortical thickening but normal architecture.  The patient has a  nodular mass in the upper outer right breast middle depth requiring  additional ultrasound workup.  No suspicious calcifications are present.  No  asymmetric density.      Ultrasound:    Targeted ultrasonography right breast at 9 o'clock in the area of the  patient's nodule reveals a lymph node of normal architecture and appearance  without cortical thickening or disorder.  Correlates with the nodule on  mammography.    Impression  No specific imaging abnormality correlating with the patient's complaints of  breast pain.  The patient has an intramammary lymph node at 9 o'clock 10 cm  from the nipple right breast.  Please note the patient previously had  ultrasound of the right axilla demonstrating a lymph node with thickened  cortex.  Six-month ultrasound follow-up right axilla advised.  Clinical  monitoring for the patient's breast pain is advised.    BREAST DENSITY SUMMARY C: The breasts are heterogeneously dense which may  obscure small masses.    Breast tissue can be either dense or not dense. Dense tissue makes it harder  to find breast cancer on a mammogram and also raises the risk of developing  breast cancer. Your breast tissue is  DENSE. In some people with dense  tissue, other imaging tests in addition to mammogram may help find cancers.  Talk  to your health care provider about breast density, risks  for breast  cancer, and your individual situation.    BI-RADS 3    BIRADS:  BIRADS - CATEGORY 3    Findings are probably benign. A short interval follow-up right axillary  ultrasound is recommended in 6 months.    OVERALL ASSESSMENT -PROBABLY BENIGN.    A letter of notification will be sent to the patient regarding the results.

## 2024-04-10 NOTE — Progress Notes (Signed)
 Review of Systems   Constitutional:  Negative for activity change, appetite change, chills, diaphoresis, fatigue, fever and unexpected weight change.   Respiratory:  Negative for apnea, cough, chest tightness, shortness of breath, wheezing and stridor.    Cardiovascular:  Negative for chest pain and leg swelling.   Gastrointestinal:  Negative for abdominal distention, abdominal pain, anal bleeding, blood in stool, constipation, diarrhea, nausea, rectal pain and vomiting.   Genitourinary:  Negative for difficulty urinating, dysuria, enuresis, flank pain, frequency, hematuria and urgency.   Musculoskeletal:  Negative for back pain.   Skin:  Negative for color change and pallor.   Allergic/Immunologic: Negative for food allergies and immunocompromised state.   Neurological:  Negative for syncope, speech difficulty, weakness, light-headedness, numbness and headaches.   Hematological:  Negative for adenopathy. Does not bruise/bleed easily.   Psychiatric/Behavioral:  The patient is not nervous/anxious.    All other systems reviewed and are negative.

## 2024-04-14 ENCOUNTER — Encounter
Payer: PRIVATE HEALTH INSURANCE | Attending: Women's Health | Primary: Student in an Organized Health Care Education/Training Program

## 2024-08-04 ENCOUNTER — Ambulatory Visit
Admit: 2024-08-04 | Discharge: 2024-08-04 | Payer: PRIVATE HEALTH INSURANCE | Attending: Student in an Organized Health Care Education/Training Program | Primary: Student in an Organized Health Care Education/Training Program

## 2024-08-04 VITALS — BP 118/82 | HR 84 | Temp 97.50000°F | Ht 68.0 in | Wt 230.0 lb

## 2024-08-04 DIAGNOSIS — E66812 Obesity, class 2: Principal | ICD-10-CM

## 2024-08-04 NOTE — Progress Notes (Signed)
 "  [facility]  [facility]  [address]  [place] [id number]     Date of Visit:  08/06/2024  Patient Name: Heather Huerta   Patient DOB:  1980-10-21     CHIEF COMPLAINT:     Heather Huerta is a 43 y.o. female who presents today for an general visit to be evaluated for the following condition(s):  Chief Complaint   Patient presents with    Weight Management    Sleep Apnea     Wants sleep study        REVIEW OF SYSTEM      Review of Systems    HISTORY OF PRESENT ILLNESS     History of Present Illness  The patient presents for evaluation of sleep apnea.    She reports a history of snoring and was referred to Dr. West, an ENT specialist. A sleep study was recommended but not conducted due to pregnancy. She is uncertain about the presence of sleep apnea. She does not experience choking, gasping, or witnessed apnea during sleep. Her sleep is restless with frequent awakenings. She does not report morning headaches, impaired attention, concentration, memory, or daytime sleepiness. She can fall asleep willingly during long drives and in movie theaters.    She has been working on weight loss with exercise and a diet of greens, lean meat, and eggs. She plans to try Ozempic  after breastfeeding and has the pen provided by the doctor.      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    Past Surgical History:   Procedure Laterality Date    INTRAUTERINE DEVICE INSERTION N/A 08/09/2018    MIrena  insertion NDC [id number]        Social History     Socioeconomic  History    Marital status: Married   Tobacco Use    Smoking status: Never     Passive exposure: Never    Smokeless tobacco: Never   Vaping Use    Vaping status: Never Used   Substance and Sexual Activity    Alcohol  use: Not Currently    Drug use: Never    Sexual activity: Yes     Partners: Male     Social Drivers of Health     Financial Resource Strain: Low Risk  (10/16/2022)    Overall Financial Resource Strain (CARDIA)     Difficulty of Paying Living Expenses: Not hard at all   Food Insecurity: No Food Insecurity (10/05/2023)    Hunger Vital Sign     Worried About Running Out of Food in the Last Year: Never true     Ran Out of Food in the Last Year: Never true   Transportation Needs: No Transportation Needs (10/05/2023)    PRAPARE - Transportation     Lack of Transportation (Medical): No     Lack of Transportation (Non-Medical): No   Housing Stability: Low Risk  (10/05/2023)  Housing Stability Vital Sign     Unable to Pay for Housing in the Last Year: No     Number of Times Moved in the Last Year: 1     Homeless in the Last Year: No        Results  Hemoglobin A1c: 5.5%    Current Outpatient Medications   Medication Sig Dispense Refill    ibuprofen  (ADVIL ;MOTRIN ) 600 MG tablet Take 1 tablet by mouth 3 times daily as needed for Pain 30 tablet 0    cephALEXin  (KEFLEX ) 500 MG capsule Take 1 capsule by mouth 4 times daily 10 capsule 0    fluconazole  (DIFLUCAN ) 150 MG tablet Take 1 tablet by mouth daily (Patient not taking: Reported on 04/10/2024) 10 tablet 0     No current facility-administered medications for this visit.         PHYSICAL EXAM     BP 118/82   Pulse 84   Temp 97.5 F (36.4 C) (Temporal)   Ht 1.727 m (5' 8)   Wt 104.3 kg (230 lb)   SpO2 98%   BMI 34.97 kg/m    Physical Exam   Physical Exam      ASSESSMENT/PLAN     Assessment & Plan  1. Sleep apnea: uncontrolled  - Reports snoring and restless sleep with multiple awakenings.  - Order baseline diagnostic sleep study.  - If positive, conduct CPAP  titration study.  - Consider dental appliance or CPAP based on severity.  -referral to respiratory sleep specialist    2. Weight management:stable  - Exercising and eating greens, lean meat, and eggs.  - Plans to try Ozempic  after breastfeeding.     Diagnosis Orders   1. Class 2 obesity due to excess calories with body mass index (BMI) of 38.0 to 38.9 in adult, unspecified whether serious comorbidity present  Baseline Diagnostic Sleep Study    [facility], [place]      2. Suspected sleep apnea  Baseline Diagnostic Sleep Study    [facility], [facility]            COMMUNICATION:     .    The patient (or guardian, if applicable) and other individuals in attendance with the patient were advised that Artificial Intelligence will be utilized during this visit to record, process the conversation to generate a clinical note, and support improvement of the AI technology. The patient (or guardian, if applicable) and other individuals in attendance at the appointment consented to the use of AI, including the recording.                  Electronically signed by Rolin Shams, MD on 08/06/2024 at 10:16 AM    "

## 2024-10-21 ENCOUNTER — Inpatient Hospital Stay
Payer: PRIVATE HEALTH INSURANCE | Attending: Student in an Organized Health Care Education/Training Program | Primary: Student in an Organized Health Care Education/Training Program
